# Patient Record
Sex: Female | Born: 1965 | Race: White | Hispanic: No | Marital: Married
Health system: Midwestern US, Community
[De-identification: ages and names within clinical notes are randomized; demographics above are authoritative.]

## PROBLEM LIST (undated history)

## (undated) DIAGNOSIS — K219 Gastro-esophageal reflux disease without esophagitis: Secondary | ICD-10-CM

## (undated) DIAGNOSIS — F329 Major depressive disorder, single episode, unspecified: Secondary | ICD-10-CM

## (undated) DIAGNOSIS — M542 Cervicalgia: Secondary | ICD-10-CM

## (undated) DIAGNOSIS — G8929 Other chronic pain: Secondary | ICD-10-CM

## (undated) DIAGNOSIS — M797 Fibromyalgia: Secondary | ICD-10-CM

## (undated) DIAGNOSIS — F32A Depression, unspecified: Secondary | ICD-10-CM

## (undated) DIAGNOSIS — R5382 Chronic fatigue, unspecified: Secondary | ICD-10-CM

## (undated) DIAGNOSIS — F1021 Alcohol dependence, in remission: Secondary | ICD-10-CM

## (undated) DIAGNOSIS — M549 Dorsalgia, unspecified: Secondary | ICD-10-CM

## (undated) DIAGNOSIS — A6920 Lyme disease, unspecified: Secondary | ICD-10-CM

## (undated) DIAGNOSIS — F909 Attention-deficit hyperactivity disorder, unspecified type: Secondary | ICD-10-CM

## (undated) DIAGNOSIS — R87619 Unspecified abnormal cytological findings in specimens from cervix uteri: Secondary | ICD-10-CM

## (undated) DIAGNOSIS — R443 Hallucinations, unspecified: Secondary | ICD-10-CM

## (undated) HISTORY — DX: Fibromyalgia: M79.7

## (undated) HISTORY — PX: HERNIA REPAIR: SHX51

## (undated) HISTORY — DX: Depression, unspecified: F32.A

## (undated) HISTORY — DX: Attention-deficit hyperactivity disorder, unspecified type: F90.9

## (undated) HISTORY — DX: Major depressive disorder, single episode, unspecified: F32.9

## (undated) HISTORY — DX: Unspecified abnormal cytological findings in specimens from cervix uteri: R87.619

## (undated) HISTORY — DX: Gastro-esophageal reflux disease without esophagitis: K21.9

## (undated) HISTORY — PX: COLPOSCOPY: SHX161

## (undated) HISTORY — DX: Alcohol dependence, in remission: F10.21

---

## 2002-10-25 HISTORY — PX: HAND SURGERY: SHX662

## 2003-01-16 ENCOUNTER — Inpatient Hospital Stay (HOSPITAL_COMMUNITY): Admission: EM | Admit: 2003-01-16 | Discharge: 2003-01-18 | Payer: Self-pay | Admitting: Emergency Medicine

## 2003-01-16 ENCOUNTER — Encounter: Payer: Self-pay | Admitting: Emergency Medicine

## 2003-01-17 ENCOUNTER — Encounter: Payer: Self-pay | Admitting: Cardiology

## 2003-09-06 ENCOUNTER — Emergency Department (HOSPITAL_COMMUNITY): Admission: EM | Admit: 2003-09-06 | Discharge: 2003-09-06 | Payer: Self-pay

## 2004-04-14 ENCOUNTER — Emergency Department (HOSPITAL_COMMUNITY): Admission: EM | Admit: 2004-04-14 | Discharge: 2004-04-14 | Payer: Self-pay | Admitting: Emergency Medicine

## 2004-08-18 ENCOUNTER — Emergency Department (HOSPITAL_COMMUNITY): Admission: EM | Admit: 2004-08-18 | Discharge: 2004-08-18 | Payer: Self-pay

## 2004-08-19 ENCOUNTER — Ambulatory Visit (HOSPITAL_COMMUNITY): Admission: RE | Admit: 2004-08-19 | Discharge: 2004-08-19 | Payer: Self-pay | Admitting: Orthopedic Surgery

## 2004-08-19 ENCOUNTER — Ambulatory Visit (HOSPITAL_BASED_OUTPATIENT_CLINIC_OR_DEPARTMENT_OTHER): Admission: RE | Admit: 2004-08-19 | Discharge: 2004-08-19 | Payer: Self-pay | Admitting: Orthopedic Surgery

## 2010-10-25 DIAGNOSIS — R87619 Unspecified abnormal cytological findings in specimens from cervix uteri: Secondary | ICD-10-CM

## 2010-10-25 HISTORY — DX: Unspecified abnormal cytological findings in specimens from cervix uteri: R87.619

## 2011-03-12 NOTE — H&P (Signed)
NAME:  Miranda [name], Miranda [name]                            ACCOUNT NO.:  [id number]   MEDICAL RECORD NO.:  [id number]                   PATIENT TYPE:  INP   LOCATION:  2023                                 FACILITY:  [facility]   PHYSICIAN:  Cassell Clement, M.D.              DATE OF BIRTH:  Apr 19, 1966   DATE OF ADMISSION:  01/16/2003  DATE OF DISCHARGE:                                HISTORY & PHYSICAL   CHIEF COMPLAINT:  Chest pain.   HISTORY:  The patient is a 45 year old single Caucasian female admitted with  chest pain.  She had the onset of chest pain while driving her car at 8:29  a.m.  The pain was sharp and like a dagger and went through her right chest  along the sternal border into the interscapular area.  Pain is worse with  deep breathing.  She has noted some improvement in the pain after sublingual  nitroglycerin on several occasions today.  She had nausea but no vomiting  and no sweating.  She has had no recent bronchitis or lung condition and she  has had no hemoptysis.  She has had no chills or fever or constitutional  symptoms.  The pain occurred while she was driving to work.  She went to  work and attempted to work but the pain became more severe and she called  her supervisor who arranged for transfer to the emergency room where she was  evaluated and admitted.  In the emergency room, she had normal chest x-ray  and normal EKG and initial set of enzymes are normal.   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  She is up for possibly a promotion to  principal and has an interview next week.  She does not drink any alcohol.  She smokes a pack of cigarettes a day.  She is single.   ALLERGIES:  No known drug allergies.   PAST MEDICAL HISTORY:  She has had no operations and no previous  hospitalizations.  She is on Wellbutrin XL 300 mg daily for depression and  has been on for 2 years.  Dr. Raquel James is her  psychiatrist.   REVIEW OF SYSTEMS:  GASTROINTESTINAL: Mild constipation.  There is no  history of peptic ulcer or gallbladder.  CARDIOVASCULAR: No history of  exertional chest pain.  She does not get any regular exercise.  GENITOURINARY: History reveals last menstrual period was 01/06/2003 and was  regular.  She denies any dysuria.  CONSTITUTIONAL: Symptoms unremarkable.  No night sweats or fever.  Remainder of review of systems negative in  detail.  Of note is the fact that the patient does not have any history of  diabetes or hypercholesterolemia.   FAMILY HISTORY:  Her family history reveals that her mother is living with  high blood pressure, her father has high blood pressure  and is alive, she  had a maternal grandfather who died of a massive heart attack.   PHYSICAL EXAMINATION:  VITAL SIGNS:  Blood pressure is 100/70, pulse is 90  and regular, respirations are normal.  GENERAL:  Well-developed, well-nourished, young woman in no distress.  SKIN:  Clear.  HEAD AND NECK:  Pupils are equal and reactive.  Sclerae are clear.  Mouth  and pharynx normal.  Jugular venous pressure normal.  Thyroid normal.  No  lymphadenopathy.  The carotids reveal no bruits.  CHEST:  Clear to percussion and auscultation.  Her chest pain is worse when  she takes a deep breath.  There is no pleural friction rub.  HEART:  No murmur, gallop, rub, or click.  ABDOMEN:  Soft without hepatosplenomegaly or masses.  EXTREMITIES:  No phlebitis or edema.  She has good peripheral pulses.   LABORATORY DATA:  Her electrocardiogram shows normal sinus rhythm and is  within normal limits.  Her initial set of enzymes are normal.  CBC is  normal.  Cholesterol and hepatic function tests are pending.   IMPRESSION:  1. Atypical chest pain not relieved by sublingual nitroglycerin, rule out     myocardial infarction, rule out viral pleurisy.  2. Risk factors include smoking and family history.   DISPOSITION:  She is being  admitted to telemetry.  We will get serial  enzymes.  She is on a beta blocker and Lovenox.  We will check her fasting  lipids.  We will give her a trial of ibuprofen and a trial of simultaneous  Nexium.  We will consider a treadmill Cardiolite stress test in the morning.                                               Cassell Clement, M.D.    TB/MEDQ  D:  01/16/2003  T:  01/16/2003  Job:  [id number]   cc:   Kizzie Furnish, M.D.  [address]  [place]  [place] [address]  Fax: [phone]

## 2011-03-12 NOTE — Op Note (Signed)
NAMEKRISTEN, Miranda [name]                  ACCOUNT NO.:  [id number]   MEDICAL RECORD NO.:  [id number]          PATIENT TYPE:  AMB   LOCATION:  DSC                          FACILITY:  [facility]   PHYSICIAN:  Cindee Salt, M.D.       DATE OF BIRTH:  [birth year]   DATE OF PROCEDURE:  08/19/2004  DATE OF DISCHARGE:                                 OPERATIVE REPORT   PREOPERATIVE DIAGNOSIS:  Cat bite with infection, left hand and forearm.   POSTOPERATIVE DIAGNOSIS:  Cat bite with infection, left hand and forearm.   OPERATION:  Incision and drainage, left hand and forearm.   SURGEON:  Cindee Salt, M.D.   ANESTHESIA:  General.   HISTORY:  The patient is a 45 year old female who suffered a cat bite over  the weekend.  She was seen in [facility] ER on August 18, 2004, where she was  placed on an antibiotic, given Unasyn and Augmentin.  She has had continued  pain.  She has some mild erythema about the areas, no fluctuance, no  lymphangitis.  Neurovascularly the hand is intact.   PROCEDURE:  The patient is brought to the operating room, where a general  anesthetic was carried out without difficulty.  She was prepped using  Duraprep in supine position, left arm free.  The arm was elevated for  exsanguination, the tourniquet placed high in the arm was inflated to 250  mmHg.  Incision was made over the cat bites on the dorsal aspect of the  dorsal aspect of the forearm, carried down through subcutaneous tissue.  Cultures were taken for both aerobic and anaerobic cultures and dissection  carried down to the fascia, which was opened to be certain that it had not  entered it.  No gross purulence was encountered.  This was irrigated and  packed.  A separate incision was made over the metacarpophalangeal joint and  carried down.  The puncture wound was visible through the extensor tendon.  Minimal swelling was present in the joint; however, this was opened to be  certain that there was no purulent material, and this  was irrigated out and  packed open with iodoform.  The bite over the web space of the ring and  little finger was then opened with a longitudinal incision, carried down  into the subcutaneous tissue.  It did not enter into the joint or between  the joint spaces.  This was irrigated and packed with iodoform.  A sterile  compressive dressing and splint was applied.  The patient tolerated the  procedure well and was taken to the recovery room for observation in  satisfactory condition.  She is given a dose of Unasyn prior to being taken  to the recovery room.  She is discharged home to return on Monday on  Augmentin and Vicodin.       GK/MEDQ  D:  08/19/2004  T:  08/19/2004  Job:  [id number]

## 2011-04-22 DIAGNOSIS — K4190 Unilateral femoral hernia, without obstruction or gangrene, not specified as recurrent: Secondary | ICD-10-CM

## 2011-04-27 ENCOUNTER — Telehealth (INDEPENDENT_AMBULATORY_CARE_PROVIDER_SITE_OTHER): Payer: Self-pay | Admitting: Surgery

## 2011-04-27 NOTE — Telephone Encounter (Signed)
Patient called, pain level 5-6 at incision - comes and goes- bm's ok - taking stool softener Pt advised to use aleve or ibuprofen b/t percocet use.  Percocet #20 written by Dr. Donell Beers. 04/27/2011

## 2011-04-27 NOTE — Telephone Encounter (Signed)
Miranda [name] [phone] needs her pain  meds refilled... pls call pharmacy

## 2011-05-03 ENCOUNTER — Encounter (INDEPENDENT_AMBULATORY_CARE_PROVIDER_SITE_OTHER): Payer: Self-pay | Admitting: Surgery

## 2011-06-14 ENCOUNTER — Telehealth (INDEPENDENT_AMBULATORY_CARE_PROVIDER_SITE_OTHER): Payer: Self-pay

## 2011-06-14 NOTE — Telephone Encounter (Signed)
RTW note faxed to Antionette Poles [phone] at patient's request. Patient stated she rtw on 05/07/2011.  OK per Dr. Gerrit Friends.  Patient advised to call back for an office visit asap while she is still in her post op period.  See copy of note in paper chart # [id number]. RMP

## 2011-10-09 ENCOUNTER — Ambulatory Visit (INDEPENDENT_AMBULATORY_CARE_PROVIDER_SITE_OTHER): Payer: BC Managed Care – PPO

## 2011-10-09 DIAGNOSIS — R05 Cough: Secondary | ICD-10-CM

## 2011-10-09 DIAGNOSIS — R059 Cough, unspecified: Secondary | ICD-10-CM

## 2011-10-09 DIAGNOSIS — F17200 Nicotine dependence, unspecified, uncomplicated: Secondary | ICD-10-CM

## 2011-12-07 ENCOUNTER — Ambulatory Visit (INDEPENDENT_AMBULATORY_CARE_PROVIDER_SITE_OTHER): Payer: BC Managed Care – PPO | Admitting: Family Medicine

## 2011-12-07 VITALS — BP 109/75 | HR 82 | Temp 98.0°F | Resp 16 | Ht 66.5 in | Wt 195.0 lb

## 2011-12-07 DIAGNOSIS — R05 Cough: Secondary | ICD-10-CM

## 2011-12-07 DIAGNOSIS — R509 Fever, unspecified: Secondary | ICD-10-CM

## 2011-12-07 DIAGNOSIS — R059 Cough, unspecified: Secondary | ICD-10-CM

## 2011-12-07 MED ORDER — AZITHROMYCIN 250 MG PO TABS: ORAL_TABLET | ORAL | Status: AC

## 2011-12-07 MED ORDER — GUAIFENESIN-CODEINE 100-10 MG/5ML PO SYRP: 5.0000 mL | ORAL_SOLUTION | Freq: Three times a day (TID) | ORAL | Status: AC | PRN

## 2011-12-07 NOTE — Progress Notes (Signed)
  Subjective:    Patient ID: Miranda [name], female    DOB: May 21, 1966, 46 y.o.   MRN: [id number]  HPISusan C [name] is a 46 y.o. female Started 3d ago - st.  Fever - tmax 101.8 yesterday.  Cough worst sx now.  Productive of green phlegm.  Insomnia o/n d/t cough.  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 WU:JWJX seltzer cold plus, ibuprofen, cough drops.   Review of Systems  Constitutional: Positive for fever and chills. Negative for appetite change.  HENT: Positive for hearing loss, ear pain, congestion, sore throat, rhinorrhea, sneezing and tinnitus.   Respiratory: Positive for cough. Negative for shortness of breath and wheezing.        Green phlegm.  Gastrointestinal: Negative for nausea, vomiting and diarrhea.  Skin: Negative for rash.       Objective:   Physical Exam  Constitutional: She is oriented to person, place, and time. She appears well-developed and well-nourished.  HENT:  Head: Normocephalic and atraumatic.  Right Ear: External ear normal.  Left Ear: External ear normal.  Nose: Right sinus exhibits maxillary sinus tenderness. Left sinus exhibits maxillary sinus tenderness.  Mouth/Throat: Oropharynx is clear and moist. No oropharyngeal exudate.  Eyes: Conjunctivae and EOM are normal. Pupils are equal, round, and reactive to light.  Neck: Neck supple.  Cardiovascular: Normal rate, regular rhythm, normal heart sounds and intact distal pulses.   Pulmonary/Chest: Effort normal and breath sounds normal. No respiratory distress. She has no wheezes.  Lymphadenopathy:    She has no cervical adenopathy.  Neurological: She is alert and oriented to person, place, and time.  Skin: Skin is warm and dry.  Psychiatric: She has a normal mood and affect.          Assessment & Plan:   1. Fever   2. Cough    Uri vs early sinobronchitis with fever.  Sx care - mucinex, fluids, rest, Zpak #1.  Return to the clinic or go to the nearest emergency room if any of your symptoms worsen  or new symptoms occur.

## 2011-12-07 NOTE — Patient Instructions (Signed)
mucinex DM during day, robitussin with codeine at night if needed.  Fluids, rest.   zpak as instructed. Return to the clinic or go to the nearest emergency room if any of your symptoms worsen or new symptoms occur.

## 2012-08-03 LAB — HM PAP SMEAR: HM Pap smear: NORMAL

## 2012-08-31 ENCOUNTER — Ambulatory Visit (INDEPENDENT_AMBULATORY_CARE_PROVIDER_SITE_OTHER): Payer: BC Managed Care – PPO | Admitting: Family Medicine

## 2012-08-31 ENCOUNTER — Ambulatory Visit: Payer: BC Managed Care – PPO

## 2012-08-31 VITALS — BP 130/74 | HR 99 | Temp 98.3°F | Resp 18 | Ht 66.0 in | Wt 195.0 lb

## 2012-08-31 DIAGNOSIS — M791 Myalgia, unspecified site: Secondary | ICD-10-CM

## 2012-08-31 DIAGNOSIS — R05 Cough: Secondary | ICD-10-CM

## 2012-08-31 DIAGNOSIS — IMO0001 Reserved for inherently not codable concepts without codable children: Secondary | ICD-10-CM

## 2012-08-31 DIAGNOSIS — R079 Chest pain, unspecified: Secondary | ICD-10-CM

## 2012-08-31 DIAGNOSIS — R059 Cough, unspecified: Secondary | ICD-10-CM

## 2012-08-31 DIAGNOSIS — M2550 Pain in unspecified joint: Secondary | ICD-10-CM

## 2012-08-31 LAB — POCT CBC
Granulocyte percent: 65.2 %G (ref 37–80)
HCT, POC: 44.5 % (ref 37.7–47.9)
Hemoglobin: 13.9 g/dL (ref 12.2–16.2)
Lymph, poc: 2.2 (ref 0.6–3.4)
MCH, POC: 29.6 pg (ref 27–31.2)
MCHC: 31.2 g/dL — AB (ref 31.8–35.4)
MCV: 94.9 fL (ref 80–97)
MID (cbc): 0.6 (ref 0–0.9)
MPV: 7.5 fL (ref 0–99.8)
POC Granulocyte: 5.3 (ref 2–6.9)
POC LYMPH PERCENT: 27.6 %L (ref 10–50)
POC MID %: 7.2 %M (ref 0–12)
Platelet Count, POC: 474 10*3/uL — AB (ref 142–424)
RBC: 4.69 M/uL (ref 4.04–5.48)
RDW, POC: 12.3 %
WBC: 8.1 10*3/uL (ref 4.6–10.2)

## 2012-08-31 LAB — POCT INFLUENZA A/B
Influenza A, POC: NEGATIVE
Influenza B, POC: NEGATIVE

## 2012-08-31 MED ORDER — HYDROCOD POLST-CHLORPHEN POLST 10-8 MG/5ML PO LQCR: 5.0000 mL | Freq: Two times a day (BID) | ORAL | Status: DC | PRN

## 2012-08-31 MED ORDER — AZITHROMYCIN 250 MG PO TABS: ORAL_TABLET | ORAL | Status: DC

## 2012-08-31 NOTE — Progress Notes (Signed)
Subjective:    Patient ID: Miranda [name], female    DOB: Mar 01, 1966, 46 y.o.   MRN: [id number]  HPI  [name] is a 46 yr old female here with one day of cough and chest pain.  States this started out of the blue yesterday.  She is coughing up "green stuff".  Chest pain occurs with cough.  There is no associated diaphoresis, nausea, arm pain, back pain, SOB.  She has felt cold but has not had a fever; however she has been taking 600mg  ibuprofen every 6 hours for the past day which may be masking fever.  She states she feels achy, has a headache.  Has some nasal congestion.  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  Has had flu shot.  States she has a history of both bronchitis and walking pneumonia.   Review of Systems  Constitutional: Positive for chills and appetite change (decreased). Negative for fever.  HENT: Positive for congestion. Negative for ear pain, sore throat, neck pain and neck stiffness.   Respiratory: Positive for cough and wheezing. Negative for shortness of breath.   Cardiovascular: Positive for chest pain (with cough).  Gastrointestinal: Negative.   Genitourinary: Negative.   Musculoskeletal: Positive for myalgias and arthralgias.  Skin: Negative.   Neurological: Positive for headaches. Negative for dizziness, syncope and light-headedness.       Objective:   Physical Exam  Vitals reviewed. Constitutional: She is oriented to person, place, and time. She appears well-developed and well-nourished. No distress.  HENT:  Head: Normocephalic and atraumatic.  Right Ear: Tympanic membrane and ear canal normal.  Left Ear: Tympanic membrane and ear canal normal.  Nose: Nose normal.  Mouth/Throat: Uvula is midline, oropharynx is clear and moist and mucous membranes are normal.  Neck: Neck supple.  Cardiovascular: Normal rate, regular rhythm and normal heart sounds.   Pulmonary/Chest: Effort normal. No accessory muscle usage. No respiratory  distress. She has no decreased breath sounds. She has no wheezes. She has rhonchi in the left middle field. She exhibits tenderness.  Lymphadenopathy:    She has no cervical adenopathy.  Neurological: She is alert and oriented to person, place, and time.  Skin: Skin is warm and dry.  Psychiatric: She has a normal mood and affect. Her behavior is normal.     Filed Vitals:   08/31/12 1503  BP: 130/74  Pulse: 99  Temp: 98.3 F (36.8 C)  Resp: 18    Results for orders placed in visit on 08/31/12  POCT INFLUENZA A/B      Component Value Range   Influenza A, POC Negative     Influenza B, POC Negative    POCT CBC      Component Value Range   WBC 8.1  4.6 - 10.2 K/uL   Lymph, poc 2.2  0.6 - 3.4   POC LYMPH PERCENT 27.6  10 - 50 %L   MID (cbc) 0.6  0 - 0.9   POC MID % 7.2  0 - 12 %M   POC Granulocyte 5.3  2 - 6.9   Granulocyte percent 65.2  37 - 80 %G   RBC 4.69  4.04 - 5.48 M/uL   Hemoglobin 13.9  12.2 - 16.2 g/dL   HCT, POC 30.8  65.7 - 47.9 %   MCV 94.9  80 - 97 fL   MCH, POC 29.6  27 - 31.2 pg   MCHC 31.2 (*) 31.8 - 35.4 g/dL  RDW, POC 12.3     Platelet Count, POC 474 (*) 142 - 424 K/uL   MPV 7.5  0 - 99.8 fL      [facility] reading (PRIMARY) by  Dr. Neva Seat - some increased markings in the RLL, possible early pneumonia.       Assessment & Plan:   1. Cough  POCT Influenza A/B, DG Chest 2 View, POCT CBC, azithromycin (ZITHROMAX) 250 MG tablet, chlorpheniramine-HYDROcodone (TUSSIONEX PENNKINETIC ER) 10-8 MG/5ML LQCR  2. Chest pain  DG Chest 2 View  3. Myalgia    4. Arthralgia      Ms. Miranda [name] is a 46 yr old female here with abrupt onset of productive cough and body aches one day ago.  Rapid flu is negative.  CXR shows a possible early pneumonia in the RLL.  Will treat with a Zpak and Tussionex for cough relief.  Chest pain is likely non-cardiac and secondary to her cough.  The pain occurs only with cough and is also somewhat reproducible on palpation.  There are no  associated symptoms of dizziness, diaphoresis, nausea, or radiating pain.  She has no risk factors aside from a smoking history.  Discussed with patient that I think her CP is cough related, but that a cardiac source is possible.  Discussed that she should proceed to the ED if CP worsens or if she develops new symptoms.  Pt is in agreement with this plan.

## 2012-08-31 NOTE — Patient Instructions (Signed)
Take ALL of the azithromycin as directed.  Use Tussionex cough syrup at night for cough relief.  Do not use this during the day as it will make you sleepy.  Drink plenty of fluids and get plenty of rest.  If you are worsening or not improving, come back in.  If the pain in your chest worsens, or if you notice other associated symptoms - dizziness, sweating, nausea, arm pain, back pain - proceed to the emergency department.

## 2012-09-01 NOTE — Progress Notes (Signed)
Xray read and patient discussed with Ms. Debbra Riding. Agree with assessment and plan of care per her note.  Radiology report: IMPRESSION:  Slight haziness in the right medial lung base is seen on the PA  image but no definite infiltrative density is evident on lateral  image. No consolidation is evident.

## 2012-12-20 ENCOUNTER — Ambulatory Visit (INDEPENDENT_AMBULATORY_CARE_PROVIDER_SITE_OTHER): Payer: BC Managed Care – PPO | Admitting: Family Medicine

## 2012-12-20 VITALS — BP 138/90 | HR 92 | Temp 98.4°F | Resp 16 | Ht 65.38 in | Wt 200.2 lb

## 2012-12-20 DIAGNOSIS — R059 Cough, unspecified: Secondary | ICD-10-CM

## 2012-12-20 DIAGNOSIS — R509 Fever, unspecified: Secondary | ICD-10-CM

## 2012-12-20 DIAGNOSIS — R05 Cough: Secondary | ICD-10-CM

## 2012-12-20 DIAGNOSIS — F32A Depression, unspecified: Secondary | ICD-10-CM | POA: Insufficient documentation

## 2012-12-20 DIAGNOSIS — F329 Major depressive disorder, single episode, unspecified: Secondary | ICD-10-CM

## 2012-12-20 DIAGNOSIS — R52 Pain, unspecified: Secondary | ICD-10-CM

## 2012-12-20 LAB — POCT INFLUENZA A/B
Influenza A, POC: NEGATIVE
Influenza B, POC: NEGATIVE

## 2012-12-20 MED ORDER — AZITHROMYCIN 250 MG PO TABS: ORAL_TABLET | ORAL | Status: DC

## 2012-12-20 NOTE — Progress Notes (Signed)
[facility] [address] [place] [address] [phone]  Date:  12/20/2012   Name:  Miranda [name]   DOB:  17-Sep-1966   MRN:  [id number]  PCP:  No primary provider on file.    Chief Complaint: Nasal Congestion and Fever   History of Present Illness:  Miranda [name] is a 47 y.o. very pleasant female patient who presents with the following:  Generally healthy female, she is a principal at an elementary school.  She recently was in close contact with an ill child- she became ill about 3 days ago.   She had a temp to around 100 degrees, but she was able to control it with motrin.   Coughing up mucus, feels very tired, aches all over.   She has noted a ST and itchy ears. No GI symptoms.   She has tried a neti- pot but this is not as helpful right now.  She did take motrin around 0530 this am.    There is no problem list on file for this patient.   Past Medical History  Diagnosis Date  . ADHD (attention deficit hyperactivity disorder)   . Depression     Past Surgical History  Procedure Laterality Date  . Hernia repair      History  Substance Use Topics  . Smoking status: Former Smoker -- 1.00 packs/day for 15 years    Types: Cigarettes    Quit date: 11/01/2011  . Smokeless tobacco: Not on file  . Alcohol Use: No    Family History  Problem Relation Age of Onset  . Diabetes Mother   . Hypertension Mother   . Heart disease Mother   . Hypertension Father   . Cancer Father     Allergies  Allergen Reactions  . Amoxicillin     Stomach pain    Medication list has been reviewed and updated.  Current Outpatient Prescriptions on File Prior to Visit  Medication Sig Dispense Refill  . lisdexamfetamine (VYVANSE) 20 MG capsule Take 50 mg by mouth every morning.       . sertraline (ZOLOFT) 100 MG tablet Take 100 mg by mouth daily.      Marland Kitchen azithromycin (ZITHROMAX) 250 MG tablet Take 2 tabs PO x 1 dose, then 1 tab PO QD x 4 days  6 tablet  0  .  chlorpheniramine-HYDROcodone (TUSSIONEX PENNKINETIC ER) 10-8 MG/5ML LQCR Take 5 mLs by mouth every 12 (twelve) hours as needed (cough).  140 mL  0   No current facility-administered medications on file prior to visit.    Review of Systems:  As per HPI- otherwise negative.   Physical Examination: Filed Vitals:   12/20/12 0759  BP: 138/90  Pulse: 92  Temp: 98.4 F (36.9 C)  Resp: 16   Filed Vitals:   12/20/12 0759  Height: 5' 5.38" (1.661 m)  Weight: 200 lb 3.2 oz (90.81 kg)   Body mass index is 32.92 kg/(m^2). Ideal Body Weight: Weight in (lb) to have BMI = 25: 151.7  GEN: WDWN, NAD, Non-toxic, A & O x 3, overweight HEENT: Atraumatic, Normocephalic. Neck supple. No masses, No LAD. Bilateral TM wnl, oropharynx normal.  PEERL,EOMI.   Slight tenderness over sinuses Ears and Nose: No external deformity. CV: RRR, No M/G/R. No JVD. No thrill. No extra heart sounds. PULM: CTA B, no wheezes, crackles, rhonchi. No retractions. No resp. distress. No accessory muscle use. Abd; soft, non tender and non- distended EXTR: No c/c/e NEURO  Normal gait.  PSYCH: Normally interactive. Conversant. Not depressed or anxious appearing.  Calm demeanor.   Results for orders placed in visit on 12/20/12  POCT INFLUENZA A/B      Result Value Range   Influenza A, POC Negative     Influenza B, POC Negative      Assessment and Plan: Fever, unspecified - Plan: POCT Influenza A/B  Body aches - Plan: POCT Influenza A/B  Cough - Plan: azithromycin (ZITHROMAX) 250 MG tablet  Treat for sinusitis with a zpack.  See patient instructions for more details.   No work until AF for 24 hours  Abbe Amsterdam, MD

## 2012-12-20 NOTE — Patient Instructions (Addendum)
Rest and drink plenty of fluids.  Let me know if you are not better in the next couple of days- Sooner if worse.    

## 2012-12-22 ENCOUNTER — Telehealth: Payer: Self-pay

## 2012-12-22 DIAGNOSIS — R05 Cough: Secondary | ICD-10-CM

## 2012-12-22 DIAGNOSIS — R059 Cough, unspecified: Secondary | ICD-10-CM

## 2012-12-22 MED ORDER — HYDROCOD POLST-CHLORPHEN POLST 10-8 MG/5ML PO LQCR: 5.0000 mL | Freq: Two times a day (BID) | ORAL | Status: DC | PRN

## 2012-12-22 NOTE — Telephone Encounter (Signed)
PT STATES SHE WAS GIVEN A Z-PAK, BUT REALLY WOULD LIKE TO HAVE SOME TUSSINEX CALLED IN FOR HER. SHE WAS COUGHING ALL NIGHT. PLEASE CALL [phone]    [facility] [address]

## 2012-12-22 NOTE — Telephone Encounter (Signed)
Sent in for her. [facility]. Called her to advise.

## 2012-12-22 NOTE — Telephone Encounter (Signed)
Rx for Tussionex printed and ready to send to pharmacy

## 2013-05-03 ENCOUNTER — Ambulatory Visit (INDEPENDENT_AMBULATORY_CARE_PROVIDER_SITE_OTHER): Payer: BC Managed Care – PPO | Admitting: Surgery

## 2013-05-03 ENCOUNTER — Encounter (INDEPENDENT_AMBULATORY_CARE_PROVIDER_SITE_OTHER): Payer: Self-pay | Admitting: Surgery

## 2013-05-03 VITALS — BP 128/74 | HR 92 | Temp 98.2°F | Resp 17 | Ht 66.0 in | Wt 200.0 lb

## 2013-05-03 DIAGNOSIS — M799 Soft tissue disorder, unspecified: Secondary | ICD-10-CM

## 2013-05-03 DIAGNOSIS — M7989 Other specified soft tissue disorders: Secondary | ICD-10-CM | POA: Insufficient documentation

## 2013-05-03 NOTE — Patient Instructions (Signed)

## 2013-05-03 NOTE — Progress Notes (Signed)
General Surgery [facility]  Visit Diagnoses: 1. Soft tissue mass, left upper quadrant abdominal wall     HISTORY: Patient is a 47 year old female known to my practice from a previous right inguinal hernia repair in 2012. She has developed a small soft tissue mass in the left upper quadrant of the abdominal wall and is concerned about another possible hernia.  EXAM: Examination is limited to the abdomen. There is a palpable less than 1 cm nodular density in the subcutaneous tissues of the mid left upper quadrant of the abdominal wall. This is moderately tender. It does not augment with cough or Valsalva. It is not reducible. There are no overlying cutaneous changes.  IMPRESSION: Soft tissue mass left upper quadrant abdominal wall, possible lipoma versus epidermal inclusion cyst versus small spigelian hernia  PLAN: Patient and I discussed this finding. She is not interested in operative exploration and excision at this time. I think it is safe to monitor this. If it becomes larger or more problematic, she will return for excisional biopsy or repair.  Patient will return for surgical care as needed.  Velora Heckler, MD, FACS General & Endocrine Surgery [facility]

## 2013-06-29 ENCOUNTER — Emergency Department (HOSPITAL_COMMUNITY): Payer: BC Managed Care – PPO

## 2013-06-29 ENCOUNTER — Emergency Department (HOSPITAL_COMMUNITY)
Admission: EM | Admit: 2013-06-29 | Discharge: 2013-06-29 | Disposition: A | Discharge status: Home / Self Care | Payer: BC Managed Care – PPO | Attending: Emergency Medicine | Admitting: Emergency Medicine

## 2013-06-29 ENCOUNTER — Encounter (HOSPITAL_COMMUNITY): Payer: Self-pay | Admitting: Emergency Medicine

## 2013-06-29 DIAGNOSIS — F909 Attention-deficit hyperactivity disorder, unspecified type: Secondary | ICD-10-CM | POA: Insufficient documentation

## 2013-06-29 DIAGNOSIS — R5381 Other malaise: Secondary | ICD-10-CM | POA: Insufficient documentation

## 2013-06-29 DIAGNOSIS — Z79899 Other long term (current) drug therapy: Secondary | ICD-10-CM | POA: Insufficient documentation

## 2013-06-29 DIAGNOSIS — R0602 Shortness of breath: Secondary | ICD-10-CM

## 2013-06-29 DIAGNOSIS — F3289 Other specified depressive episodes: Secondary | ICD-10-CM | POA: Insufficient documentation

## 2013-06-29 DIAGNOSIS — M25473 Effusion, unspecified ankle: Secondary | ICD-10-CM | POA: Insufficient documentation

## 2013-06-29 DIAGNOSIS — M25476 Effusion, unspecified foot: Secondary | ICD-10-CM | POA: Insufficient documentation

## 2013-06-29 DIAGNOSIS — R112 Nausea with vomiting, unspecified: Secondary | ICD-10-CM | POA: Insufficient documentation

## 2013-06-29 DIAGNOSIS — F329 Major depressive disorder, single episode, unspecified: Secondary | ICD-10-CM | POA: Insufficient documentation

## 2013-06-29 DIAGNOSIS — Z8719 Personal history of other diseases of the digestive system: Secondary | ICD-10-CM | POA: Insufficient documentation

## 2013-06-29 DIAGNOSIS — Z87891 Personal history of nicotine dependence: Secondary | ICD-10-CM | POA: Insufficient documentation

## 2013-06-29 LAB — CBC WITH DIFFERENTIAL/PLATELET
Basophils Absolute: 0 10*3/uL (ref 0.0–0.1)
Basophils Relative: 0 % (ref 0–1)
Eosinophils Absolute: 0 10*3/uL (ref 0.0–0.7)
Eosinophils Relative: 0 % (ref 0–5)
HCT: 38.1 % (ref 36.0–46.0)
Hemoglobin: 12.7 g/dL (ref 12.0–15.0)
Lymphocytes Relative: 20 % (ref 12–46)
Lymphs Abs: 2 10*3/uL (ref 0.7–4.0)
MCH: 30.5 pg (ref 26.0–34.0)
MCHC: 33.3 g/dL (ref 30.0–36.0)
MCV: 91.6 fL (ref 78.0–100.0)
Monocytes Absolute: 0.4 10*3/uL (ref 0.1–1.0)
Monocytes Relative: 5 % (ref 3–12)
Neutro Abs: 7.2 10*3/uL (ref 1.7–7.7)
Neutrophils Relative %: 75 % (ref 43–77)
Platelets: 397 10*3/uL (ref 150–400)
RBC: 4.16 MIL/uL (ref 3.87–5.11)
RDW: 12.5 % (ref 11.5–15.5)
WBC: 9.7 10*3/uL (ref 4.0–10.5)

## 2013-06-29 LAB — COMPREHENSIVE METABOLIC PANEL
ALT: 17 U/L (ref 0–35)
AST: 16 U/L (ref 0–37)
Albumin: 3.9 g/dL (ref 3.5–5.2)
Alkaline Phosphatase: 57 U/L (ref 39–117)
BUN: 12 mg/dL (ref 6–23)
CO2: 26 mEq/L (ref 19–32)
Calcium: 9.4 mg/dL (ref 8.4–10.5)
Chloride: 105 mEq/L (ref 96–112)
Creatinine, Ser: 0.72 mg/dL (ref 0.50–1.10)
GFR calc Af Amer: >90 mL/min (ref >90)
GFR calc non Af Amer: >90 mL/min (ref >90)
Glucose, Bld: 103 mg/dL — ABNORMAL HIGH (ref 70–99)
Potassium: 4 mEq/L (ref 3.5–5.1)
Sodium: 140 mEq/L (ref 135–145)
Total Bilirubin: 0.3 mg/dL (ref 0.3–1.2)
Total Protein: 7.3 g/dL (ref 6.0–8.3)

## 2013-06-29 LAB — POCT I-STAT TROPONIN I: Troponin i, poc: 0 ng/mL (ref 0.00–0.08)

## 2013-06-29 LAB — LIPASE, BLOOD: Lipase: 46 U/L (ref 11–59)

## 2013-06-29 LAB — D-DIMER, QUANTITATIVE: D-Dimer, Quant: <0.27 ug/mL-FEU (ref 0.00–0.48)

## 2013-06-29 MED ORDER — SODIUM CHLORIDE 0.9 % IV BOLUS (SEPSIS)
1000.0000 mL | Freq: Once | INTRAVENOUS | Status: AC
  Administered 2013-06-29: 1000 mL via INTRAVENOUS

## 2013-06-29 MED ORDER — ONDANSETRON HCL 4 MG/2ML IJ SOLN
4.0000 mg | INTRAMUSCULAR | Status: AC
  Administered 2013-06-29: 4 mg via INTRAVENOUS
  Filled 2013-06-29: qty 2

## 2013-06-29 MED ORDER — KETOROLAC TROMETHAMINE 30 MG/ML IJ SOLN
30.0000 mg | Freq: Once | INTRAMUSCULAR | Status: AC
  Administered 2013-06-29: 30 mg via INTRAVENOUS
  Filled 2013-06-29: qty 1

## 2013-06-29 MED ORDER — MORPHINE SULFATE 4 MG/ML IJ SOLN
4.0000 mg | Freq: Once | INTRAMUSCULAR | Status: AC
  Administered 2013-06-29: 4 mg via INTRAVENOUS
  Filled 2013-06-29: qty 1

## 2013-06-29 NOTE — ED Notes (Signed)
Lab called to add BNP, states will add.

## 2013-06-29 NOTE — ED Notes (Signed)
Weak sob cp since last night vomiting sporatic x 2 days

## 2013-06-29 NOTE — ED Provider Notes (Signed)
CSN: [id number]     Arrival date & time 06/29/13  1431 History   First MD Initiated Contact with Patient 06/29/13 1516     Chief Complaint  Patient presents with  . Chest Pain  . Emesis  . Shortness of Breath   (Consider location/radiation/quality/duration/timing/severity/associated sxs/prior Treatment) HPI Comments: Patient is a 47 y/o female with no significant PMH who presents for fatigues, chest pressure and dyspnea on exertion x 2 days. Patient describes the chest pain as a pressure that is nonradiating. Pain is worse when exerting herself and improved slightly when lying supine. Patient has not tried any OTC remedies for symptoms. She states that she is very active usually and has been becoming increasingly short of breath with symptoms. She endorses 2 episodes of spontaneous NB/NB emesis in the last week and has also noticed some swelling in her b/l ankles as well as calf tenderness when ambulating. She denies associated fevers, lightheadedness, dizziness, syncope, hemoptysis, cough, abdominal pain, urinary symptoms, diarrhea, melena, hematochezia, and vaginal c/o. Patient further denies recent surgeries or hospitalizations, prolonged periods of immobility, and HRT. Denies hx of DVT/PE, coagulopathies, ACS, CAD, and DM. Patient does endorse family hx of ACS in relative at age of ~19. Patient is a former smoker; quit 1 year ago.  The history is provided by the patient. No language interpreter was used.    Past Medical History  Diagnosis Date  . ADHD (attention deficit hyperactivity disorder)   . Depression   . GERD (gastroesophageal reflux disease)    Past Surgical History  Procedure Laterality Date  . Hernia repair     Family History  Problem Relation Age of Onset  . Diabetes Mother   . Hypertension Mother   . Heart disease Mother   . Hypertension Father   . Cancer Father    History  Substance Use Topics  . Smoking status: Former Smoker -- 1.00 packs/day for 15 years   Types: Cigarettes    Quit date: 11/01/2011  . Smokeless tobacco: Not on file  . Alcohol Use: No   OB History   Grav Para Term Preterm Abortions TAB SAB Ect Mult Living                 Review of Systems  Constitutional: Positive for fatigue. Negative for fever.  Respiratory: Positive for shortness of breath. Negative for cough.   Cardiovascular: Positive for chest pain.  Gastrointestinal: Positive for nausea and vomiting.  Neurological: Negative for syncope and numbness.  All other systems reviewed and are negative.    Allergies  Amoxicillin  Home Medications   Current Outpatient Rx  Name  Route  Sig  Dispense  Refill  . amphetamine-dextroamphetamine (ADDERALL) 30 MG tablet   Oral   Take 30 mg by mouth 2 (two) times daily.         Marland Kitchen HYDROcodone-acetaminophen (NORCO/VICODIN) 5-325 MG per tablet   Oral   Take 1 tablet by mouth every 6 (six) hours as needed for pain.         Marland Kitchen ibuprofen (ADVIL,MOTRIN) 200 MG tablet   Oral   Take 800 mg by mouth every 6 (six) hours as needed for pain.         Marland Kitchen sertraline (ZOLOFT) 100 MG tablet   Oral   Take 100 mg by mouth daily.         . traZODone (DESYREL) 100 MG tablet   Oral   Take 100 mg by mouth at bedtime.  BP 122/56  Pulse 74  Temp(Src) 98.7 F (37.1 C)  Resp 13  SpO2 100%  Physical Exam  Nursing note and vitals reviewed. Constitutional: She is oriented to person, place, and time. She appears well-developed and well-nourished. No distress.  HENT:  Head: Normocephalic and atraumatic.  Mouth/Throat: Oropharynx is clear and moist. No oropharyngeal exudate.  Eyes: Conjunctivae and EOM are normal. Pupils are equal, round, and reactive to light. No scleral icterus.  Neck: Normal range of motion.  Cardiovascular: Normal rate, regular rhythm, normal heart sounds and intact distal pulses.   Pulmonary/Chest: Effort normal. No respiratory distress. She has no wheezes. She has no rales.  Abdominal: Soft.  She exhibits no distension and no mass. There is tenderness (mild focal in RUQ). There is no rebound and no guarding.  No peritoneal signs. Negative Murphy's sign and no tenderness to palpation at McBurney's point.  Musculoskeletal: Normal range of motion.  Neurological: She is alert and oriented to person, place, and time.  Skin: Skin is warm and dry. No rash noted. She is not diaphoretic. No erythema. No pallor.  Psychiatric: She has a normal mood and affect. Her behavior is normal.    ED Course  Procedures (including critical care time) Labs Review Labs Reviewed  COMPREHENSIVE METABOLIC PANEL - Abnormal; Notable for the following:    Glucose, Bld 103 (*)    All other components within normal limits  CBC WITH DIFFERENTIAL  LIPASE, BLOOD  D-DIMER, QUANTITATIVE  PRO B NATRIURETIC PEPTIDE  POCT I-STAT TROPONIN I   Imaging Review Dg Chest 2 View  06/29/2013   *RADIOLOGY REPORT*  Clinical Data: Chest tightness and shortness of breath.  CHEST - 2 VIEW  Comparison: 08/31/2012  Findings: The lungs are clear without focal infiltrate, edema, pneumothorax or pleural effusion. The cardiopericardial silhouette is within normal limits for size. Imaged bony structures of the thorax are intact.  IMPRESSION: Normal exam.   Original Report Authenticated By: Kennith Center, M.D.   US Abdomen Complete  06/29/2013   CLINICAL DATA:  Chest pain. Emesis. Shortness of breath.  EXAM: ABDOMEN ULTRASOUND  COMPARISON:  None.  FINDINGS: Gallbladder  No gallstones or wall thickening. Negative sonographic Murphy's sign.  Common bile duct  Diameter: 3 mm  Liver  No focal lesion identified. Within normal limits in parenchymal echogenicity.  IVC  No abnormality visualized.  Pancreas  Visualized portion unremarkable.  Spleen  Size and appearance within normal limits.  Right Kidney  Length: 11.3 cm Echogenicity within normal limits. No mass or hydronephrosis visualized.  Left Kidney  Length: 11.6 cm Echogenicity within normal  limits. No mass or hydronephrosis visualized.  Abdominal aorta  No aneurysm visualized.  IMPRESSION: Negative abdominal ultrasound.   Electronically Signed   By: Herbie Baltimore   On: 06/29/2013 18:50   Date: 06/29/2013  Rate: 99  Rhythm: normal sinus rhythm  QRS Axis: normal  Intervals: normal  ST/T Wave abnormalities: normal  Conduction Disutrbances:none  Narrative Interpretation: NSR; no STEMI or ischemic change  Old EKG Reviewed: none available I have personally reviewed and interpreted this EKG  MDM   1. Shortness of breath    47 year old female who presents for shortness of breath with associated chest pressure x 2 days; she also endorses 2 episodes of spontaneous NB/NB emesis in the past few days. Patient work up in ED with CBC, CMP, troponin, lipase, d-dimer, BNP, and EKG as well as a chest x-ray and abdominal ultrasound. Patient, on arrival, is well and nontoxic appearing, hemodynamically  stable, and afebrile. Labs without anemia or electrolyte balance; liver and kidney function preserved. Lipase within normal limits and exam nonsuggestive of pancreatitis. D-dimer also within normal limits. Doubt pulmonary embolism given blood work as well as the fact that the patient has been without tachycardia, tachypnea, dyspnea, or hypoxia for duration of ED stay. She is PERC negative. Chest x-ray without evidence of pneumonia, pleural effusion, pneumothorax, or other acute cardiopulmonary abnormality. Patient EKG without STEMI or ischemic change and troponin 0.00. Doubt ACS given atypical nature of symptoms, physical exam, reassuring workup, and TIMI score of 0. BNP normal; no evidence to suggest congestive heart failure. Abdominal ultrasound also without evidence of cholecystitis or cholelithiasis; all intra-abdominal processes identified are within normal limits.  Patient with extensive workup for shortness of breath without significant findings. As she has remained hemodynamically stable, well  and nontoxic appearing, and afebrile, believe the patient is stable for discharge with primary care followup for further evaluation of her symptoms. Indications for ED return discussed and patient agreeable to plan with no unaddressed concerns.    Antony Madura, PA-C 06/29/13 1926  Antony Madura, PA-C 06/29/13 1928

## 2013-06-30 NOTE — ED Provider Notes (Signed)
Medical screening examination/treatment/procedure(s) were performed by non-physician practitioner and as supervising physician I was immediately available for consultation/collaboration.   Shelda Jakes, MD 06/30/13 1314

## 2013-07-02 ENCOUNTER — Encounter: Payer: Self-pay | Admitting: Family Medicine

## 2013-07-02 ENCOUNTER — Ambulatory Visit (INDEPENDENT_AMBULATORY_CARE_PROVIDER_SITE_OTHER): Payer: BC Managed Care – PPO | Admitting: Family Medicine

## 2013-07-02 VITALS — BP 123/83 | HR 95 | Temp 98.6°F | Resp 16 | Ht 66.0 in | Wt 206.0 lb

## 2013-07-02 DIAGNOSIS — F1021 Alcohol dependence, in remission: Secondary | ICD-10-CM

## 2013-07-02 DIAGNOSIS — F909 Attention-deficit hyperactivity disorder, unspecified type: Secondary | ICD-10-CM

## 2013-07-02 DIAGNOSIS — F1020 Alcohol dependence, uncomplicated: Secondary | ICD-10-CM

## 2013-07-02 DIAGNOSIS — R52 Pain, unspecified: Secondary | ICD-10-CM

## 2013-07-02 DIAGNOSIS — F32A Depression, unspecified: Secondary | ICD-10-CM

## 2013-07-02 DIAGNOSIS — F3289 Other specified depressive episodes: Secondary | ICD-10-CM

## 2013-07-02 DIAGNOSIS — F329 Major depressive disorder, single episode, unspecified: Secondary | ICD-10-CM

## 2013-07-02 MED ORDER — TRAZODONE HCL 100 MG PO TABS: 100.0000 mg | ORAL_TABLET | Freq: Every day | ORAL | Status: DC

## 2013-07-02 MED ORDER — HYDROCODONE-ACETAMINOPHEN 5-325 MG PO TABS: ORAL_TABLET | ORAL | Status: DC

## 2013-07-02 MED ORDER — AMPHETAMINE-DEXTROAMPHETAMINE 30 MG PO TABS: 30.0000 mg | ORAL_TABLET | Freq: Two times a day (BID) | ORAL | Status: DC

## 2013-07-02 MED ORDER — SERTRALINE HCL 100 MG PO TABS: 100.0000 mg | ORAL_TABLET | Freq: Every day | ORAL | Status: DC

## 2013-07-02 NOTE — Progress Notes (Signed)
Office Note 07/03/2013  CC:  Chief Complaint  Patient presents with  . Establish Care    HPI:  Miranda [name] is a 47 y.o. White female who is here with her female partner to establish care. Patient's most recent primary MD: Julie Swaziland, PA at [facility] in [place], --pt reports that the practice is closing.  Also is seeing a Rheumatologist: Dr. Kathi Ludwig at [facility].  Getting w/u for possible RA. Old records in EPIC/HL EMR were reviewed prior to or during today's visit.  Describes several year hx of body pains but has always remained active/worked.  However, the last 2 mo things have severely worsened: very bad pain and stiffness in morning, severe exhaustion with just getting ready to go to work in the mornings. Primary locations: hips, hands, fingers, back, but admits that a lot of the time she cannot localize the pain--"it's just everywhere". Recently has felt like her legs have been heavy, "dragging them" to go up stairs. Appetite down, particularly in regard to meats.  Has been vegetarian x 2 mo or so now.  Denies any abd pain, nausea, vomiting, or gassiness with eating--just feels repulsed by the thought of eating meat.  Recent episode of SOB that led to an ED visit.  Onset of SOB suddenly and felt heaviness in chest, lasted 2 days.  Full w/u in ED was unrevealing, IV morphine resolved her pain and she woke up the next day feeling fine--no SOB or CP. It has returned since then episodically, no known trigger, milder intensity.  Lasts anywhere from a couple of hours to a couple of days.  Initially, it was thought it was secondary to adderall b/c she was switched from vyvanse to this med around the same time she started having her currrent problems.  She cut her dose in half and has had days in which she did not take it at all but pt says sx's unchanged (she was on vyvanse prior to adderall but duration of action was not adequate per her report). Pt feels like a few  joints in hands have swollen and that her ankles have swollen but no other joint swelling. Describes "terrible" muscle achiness and stiffness in neck and both shoulders.  Once again, she gets vague very quickly, says pain is "everywhere".  No rashes.  No oral ulcers.  No unexplained fevers.  Anxiety hx: recalls one panic attack in 2002 that required brief hospitalization due to initial worry of heart attack. Most recent episode lasted much longer and was "different" in that it had a different kind of CP and no diaphoresis.  ROS: no dysuria, hematuria, urinary frequency, or urinary urgency.  No melena or hematochezia.  No HA's, no vision or hearing changes.  NO URI or ST.  +Some coughing but not clearly related to her chest/breathing complaints. No paresthesias or focal weakness.  No diarrhea.  +Some GER.   Past Medical History  Diagnosis Date  . ADHD (attention deficit hyperactivity disorder)   . Depression   . GERD (gastroesophageal reflux disease)   . Alcoholism in recovery     Past Surgical History  Procedure Laterality Date  . Hernia repair      Right inguinal and right femoral hernia (2 yrs apart, both repaired by Dr. Gerrit Friends)  . Hand surgery Left 2004    Cat bite--I&D for infection    Family History  Problem Relation Age of Onset  . Diabetes Mother   . Hypertension Mother   . Heart disease  Mother   . Hypertension Father   . Cancer Father     Prostate and melanoma  . Cancer Maternal Grandmother   . Heart disease Maternal Grandfather   . Heart disease Paternal Grandmother   . Heart disease Paternal Grandfather     History   Social History  . Marital Status: Single    Spouse Name: N/A    Number of Children: N/A  . Years of Education: N/A   Occupational History  . Not on file.   Social History Main Topics  . Smoking status: Former Smoker -- 1.00 packs/day for 15 years    Types: Cigarettes    Quit date: 11/01/2011  . Smokeless tobacco: Never Used  . Alcohol  Use: No  . Drug Use: No  . Sexual Activity: No   Other Topics Concern  . Not on file   Social History Narrative   Lives with partner in [place].  Orig from [place].   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   Ed level: Masters Degree   Tob 10 pack yr hx quit 2 yrs ago.   Alcoholism: quit 2009.   Outpatient Encounter Prescriptions as of 07/02/2013  Medication Sig Dispense Refill  . sertraline (ZOLOFT) 100 MG tablet Take 1 tablet (100 mg total) by mouth daily.  30 tablet  6  . traZODone (DESYREL) 100 MG tablet Take 1 tablet (100 mg total) by mouth at bedtime.  30 tablet  6  . [DISCONTINUED] sertraline (ZOLOFT) 100 MG tablet Take 100 mg by mouth daily.      . [DISCONTINUED] traZODone (DESYREL) 100 MG tablet Take 100 mg by mouth at bedtime.       Marland Kitchen amphetamine-dextroamphetamine (ADDERALL) 30 MG tablet Take 1 tablet (30 mg total) by mouth 2 (two) times daily.  60 tablet  0  . HYDROcodone-acetaminophen (NORCO/VICODIN) 5-325 MG per tablet 1-2 tabs po q6h prn pain  10 tablet  0  . ibuprofen (ADVIL,MOTRIN) 200 MG tablet Take 800 mg by mouth every 6 (six) hours as needed for pain.      . [DISCONTINUED] amphetamine-dextroamphetamine (ADDERALL) 30 MG tablet Take 30 mg by mouth 2 (two) times daily.      . [DISCONTINUED] HYDROcodone-acetaminophen (NORCO/VICODIN) 5-325 MG per tablet Take 1 tablet by mouth every 6 (six) hours as needed for pain.      . [DISCONTINUED] VYVANSE 50 MG capsule        No facility-administered encounter medications on file as of 07/02/2013.    Allergies  Allergen Reactions  . Amoxicillin     Stomach pain    ROS See HPI PE; Blood pressure 123/83, pulse 95, temperature 98.6 F (37 C), temperature source Temporal, resp. rate 16, height 5\' 6"  (1.676 m), weight 206 lb (93.441 kg), last menstrual period 06/28/2013, SpO2 98.00%. Pt examined with her female partner in the room. Gen: Alert, well appearing.  Patient is oriented to person, place, time, and situation. ENT:    Eyes: no injection, icteris, swelling, or exudate.  EOMI, PERRLA. Nose: no drainage or turbinate edema/swelling.  No injection or focal lesion.  Mouth: lips without lesion/swelling.  Oral mucosa pink and moist, without ulceration or other lesion.  Dentition intact and without obvious caries or gingival swelling.  Oropharynx without erythema, exudate, or swelling.  Neck - No masses or thyromegaly or limitation in range of motion CV: RRR, no m/r/g.   LUNGS: CTA bilat, nonlabored resps, good aeration in all lung fields. ABD: soft, diffusely TTP even with fairly  light/shallow palpation--voluntary guarding at times, ND, BS normal.  No hepatospenomegaly or mass.  No bruits. EXT: no clubbing, cyanosis, or edema.  Musculoskeletal: no joint swelling, erythema, or warmth.   She is tender to palpation literally everywhere I touch.  ROM of all joints intact. Skin - no sores or suspicious lesions or rashes or color changes  Pertinent labs:  None today  ASSESSMENT AND PLAN:   New pt.  Obtain old records.  Total body pain In the midst of Rheum w/u with [place]. Kathi [place] Obtain records. I agreed to do a small amount of pain med today, as she reports this does bring some relief.  However, if no objective evidence of rheumatologic problem then will need to discuss with pt alternative approaches to pain treatment. Vicodin 5/325, 1-2 q6h prn pain, #10, no RF.  Depression Stable on zoloft 100mg  qd. Sleep good on trazodone 100 mg qhs. Continue these.  Alcoholism in recovery Doing well with recovery--sober since 2009.  Adult ADHD Doing better on current regimen than she did on vyvanse. Rx for 1 mo supply given today: Adderall 30mg , 1/2-1 tab po bid, #60, no RF.  Spent 35 min with pt today, with >50% of this time spent in counseling and care coordination regarding the above problems.  An After Visit Summary was printed and given to the patient.  Return in about 1 month (around 08/01/2013) for f/u ADD  and rheum issues.

## 2013-07-03 DIAGNOSIS — R52 Pain, unspecified: Secondary | ICD-10-CM | POA: Insufficient documentation

## 2013-07-03 DIAGNOSIS — F909 Attention-deficit hyperactivity disorder, unspecified type: Secondary | ICD-10-CM | POA: Insufficient documentation

## 2013-07-03 DIAGNOSIS — F1021 Alcohol dependence, in remission: Secondary | ICD-10-CM | POA: Insufficient documentation

## 2013-07-03 NOTE — Assessment & Plan Note (Signed)
In the midst of Rheum w/u with Dr. Kathi Ludwig in [place] Obtain records. I agreed to do a small amount of pain med today, as she reports this does bring some relief.  However, if no objective evidence of rheumatologic problem then will need to discuss with pt alternative approaches to pain treatment. Vicodin 5/325, 1-2 q6h prn pain, #10, no RF.

## 2013-07-03 NOTE — Assessment & Plan Note (Signed)
Doing better on current regimen than she did on vyvanse. Rx for 1 mo supply given today: Adderall 30mg , 1/2-1 tab po bid, #60, no RF.

## 2013-07-03 NOTE — Assessment & Plan Note (Signed)
Doing well with recovery--sober since 2009.

## 2013-07-03 NOTE — Assessment & Plan Note (Signed)
Stable on zoloft 100mg  qd. Sleep good on trazodone 100 mg qhs. Continue these.

## 2013-07-05 ENCOUNTER — Telehealth: Payer: Self-pay | Admitting: Family Medicine

## 2013-07-05 DIAGNOSIS — R5383 Other fatigue: Secondary | ICD-10-CM | POA: Insufficient documentation

## 2013-07-05 MED ORDER — HYDROCODONE-ACETAMINOPHEN 5-325 MG PO TABS: ORAL_TABLET | ORAL | Status: DC

## 2013-07-05 NOTE — Telephone Encounter (Signed)
Patient called stating that her rheumatologist has made her a referral to pain management but she can't get in until next week.  Can you refill her vicodin until she can get into Pain management.  Please advise.

## 2013-07-05 NOTE — Telephone Encounter (Signed)
OK.  Rx printed for #30 Vicodin tabs. Pls request office notes from her rheum MD, Dr. Kathi Ludwig at [facility].  Also, see if patient has any specific info about her pain mgmt office (plus the date of her appt) so we can just verify info. -thx

## 2013-07-06 ENCOUNTER — Encounter: Payer: Self-pay | Admitting: Family Medicine

## 2013-07-06 NOTE — Telephone Encounter (Signed)
Noted. If she requests another RF after this then we'll have to get this info before any pain med rx is considered again.  --thx

## 2013-07-06 NOTE — Telephone Encounter (Signed)
Patient states that she is going to be going to preferred pain management and will see Dr. Jordan Likes.  There is no set date of appointment yet she is still waiting to here from them but believes that she will be seen sometime next week.  I faxed Rx to pharmacy.

## 2013-07-12 ENCOUNTER — Encounter: Payer: Self-pay | Admitting: Family Medicine

## 2013-07-12 ENCOUNTER — Other Ambulatory Visit: Payer: Self-pay | Admitting: Family Medicine

## 2013-07-12 ENCOUNTER — Ambulatory Visit (INDEPENDENT_AMBULATORY_CARE_PROVIDER_SITE_OTHER): Payer: BC Managed Care – PPO | Admitting: Family Medicine

## 2013-07-12 ENCOUNTER — Ambulatory Visit (HOSPITAL_BASED_OUTPATIENT_CLINIC_OR_DEPARTMENT_OTHER)
Admission: RE | Admit: 2013-07-12 | Discharge: 2013-07-12 | Disposition: A | Discharge status: Home / Self Care | Payer: BC Managed Care – PPO | Source: Ambulatory Visit | Attending: Family Medicine | Admitting: Family Medicine

## 2013-07-12 VITALS — BP 135/86 | HR 101 | Temp 99.4°F | Resp 18 | Ht 66.0 in | Wt 204.0 lb

## 2013-07-12 DIAGNOSIS — R5381 Other malaise: Secondary | ICD-10-CM | POA: Insufficient documentation

## 2013-07-12 DIAGNOSIS — R209 Unspecified disturbances of skin sensation: Secondary | ICD-10-CM

## 2013-07-12 DIAGNOSIS — R531 Weakness: Secondary | ICD-10-CM

## 2013-07-12 DIAGNOSIS — R208 Other disturbances of skin sensation: Secondary | ICD-10-CM

## 2013-07-12 DIAGNOSIS — M6281 Muscle weakness (generalized): Secondary | ICD-10-CM

## 2013-07-12 DIAGNOSIS — R449 Unspecified symptoms and signs involving general sensations and perceptions: Secondary | ICD-10-CM

## 2013-07-12 MED ORDER — PREGABALIN 150 MG PO CAPS: 150.0000 mg | ORAL_CAPSULE | Freq: Two times a day (BID) | ORAL | Status: DC

## 2013-07-12 NOTE — Progress Notes (Addendum)
OFFICE NOTE  07/12/2013  CC:  Chief Complaint  Patient presents with  . Pain     HPI: Patient is a 47 y.o. Caucasian female who is here "to discuss health issues".   I've seen her once--new pt visit. She has recently undergone extensive blood and radiographic w/u for possible rheum dz by Dr. Kathi Ludwig and this was all normal.  She has been referred by Dr. Kathi Ludwig to preferred pain mgmt and her first appt is 07/17/13.   Dr. Kathi Ludwig started a trial of lyrica 75mg  bid on 07/05/13 and plans to see her back for f/u in 4 wks.  Today she reports still hurting everywhere, with no focus of pain any worse than another--"I can barely get out of bed in the morning".  Also reports fingers on right hand and toes on right foot tingle.  She has "low grade" fever (99.5 or less), and skin bruising.  She is worried she may have something that is mimicking fibromyalgia.  Pertinent PMH:  Past Medical History  Diagnosis Date  . ADHD (attention deficit hyperactivity disorder)   . Depression   . GERD (gastroesophageal reflux disease)   . Alcoholism in recovery   . Fibromyalgia syndrome     Extensive rheum w/u by Dr. Kathi Ludwig (Rheum) 06/2013 --all NORMAL.  Marland Kitchen Abnormal Pap smear of cervix 2012    MEDS:  Outpatient Prescriptions Prior to Visit  Medication Sig Dispense Refill  . amphetamine-dextroamphetamine (ADDERALL) 30 MG tablet Take 1 tablet (30 mg total) by mouth 2 (two) times daily.  60 tablet  0  . sertraline (ZOLOFT) 100 MG tablet Take 1 tablet (100 mg total) by mouth daily.  30 tablet  6  . traZODone (DESYREL) 100 MG tablet Take 1 tablet (100 mg total) by mouth at bedtime.  30 tablet  6  . HYDROcodone-acetaminophen (NORCO/VICODIN) 5-325 MG per tablet 1-2 tabs po q6h prn pain  30 tablet  0  . ibuprofen (ADVIL,MOTRIN) 200 MG tablet Take 800 mg by mouth every 6 (six) hours as needed for pain.       No facility-administered medications prior to visit.    PE: Blood pressure 135/86, pulse 101, temperature 99.4 F  (37.4 C), temperature source Temporal, resp. rate 18, height 5\' 6"  (1.676 m), weight 204 lb (92.534 kg), last menstrual period 06/28/2013, SpO2 99.00%. Gen: Alert, well appearing.  Patient is oriented to person, place, time, and situation. AFFECT: appears in discomfort but is pleasant and displays normal range of emotion, lucid thought and speech. YQI:HKVQ: no injection, icteris, swelling, or exudate.  EOMI, PERRLA. Nose: no drainage or turbinate edema/swelling.  No injection or focal lesion.  Mouth: lips without lesion/swelling.  Oral mucosa pink and moist.  Dentition intact and without obvious caries or gingival swelling.  Oropharynx without erythema, exudate, or swelling.  Neck - No masses or thyromegaly or limitation in range of motion CV: RRR, no m/r/g.   LUNGS: CTA bilat, nonlabored resps, good aeration in all lung fields. ABD: soft, NT/ND EXT: no clubbing, cyanosis, or edema.  Muscles: no tenderness.  No joint swelling or tenderness. She has pain with every movement that she is asked to do. Neuro: CN 2-12 intact grossly bilat.  FNF is slightly impaired on right, as is heel-shin-ankle movement on right. Right arm and leg with 4/5 strength compared to 5/5 on left side. Decreased sensation (fine touch) in all toes of right foot except 5th toe, and all fingers of right hand except 5th digit.  Poor balance when asked  to balance on one foot, right worse than left.   Babinski testing elicits no upgoing or downgoing response on either side but patient states" ooh I feel that" on each side.   SKIN: she has 6-8 nonpalpable ecchymoses about 2cm -4 cm in size on LE's (mostly anterior tibial regions), these appear to be of various ages.  One fresh -looking ecchymoses on left upper arm.  No rash or focal lesions other than the bruises.  The bruises are nontender.  IMPRESSION AND PLAN:  1) Chronic pain syndrome most consistent with fibromyalgia, but she lacks the myofascial tenderness associated with  this disease.  I feel like rheumatologic dz has been effectively ruled out by her rheumatologist, and she has had a decent positive response to lyrica.  Will increase lyrica to 150mg  bid.  2) Acute (2 days) neurologic sx's, with evidence of right sided weakness and decreased sensation on exam today.  Will r/o acute CVA with noncontrast head CT now. If this is negative, will defer further w/u of these abnormalities to neurology: she prefers to see Dr. Meryl Crutch with [facility] if possible.  3) Bruising: benign-appearing.  Spent 60 min with pt today, with >50% of this time spent in counseling and care coordination regarding the above problems.   FOLLOW UP:  1 month   Addendum: CT head was negative today. Will proceed with neuro referral for further help.--PM

## 2013-07-13 ENCOUNTER — Other Ambulatory Visit: Payer: Self-pay | Admitting: Family Medicine

## 2013-07-28 ENCOUNTER — Ambulatory Visit (INDEPENDENT_AMBULATORY_CARE_PROVIDER_SITE_OTHER): Payer: BC Managed Care – PPO | Admitting: Family Medicine

## 2013-07-28 VITALS — BP 100/68 | HR 87 | Temp 98.6°F | Resp 12 | Ht 65.75 in | Wt 204.0 lb

## 2013-07-28 DIAGNOSIS — R5381 Other malaise: Secondary | ICD-10-CM

## 2013-07-28 DIAGNOSIS — H6690 Otitis media, unspecified, unspecified ear: Secondary | ICD-10-CM

## 2013-07-28 DIAGNOSIS — J029 Acute pharyngitis, unspecified: Secondary | ICD-10-CM

## 2013-07-28 DIAGNOSIS — H6691 Otitis media, unspecified, right ear: Secondary | ICD-10-CM

## 2013-07-28 LAB — POCT CBC
HCT, POC: 39.5 % (ref 37.7–47.9)
Hemoglobin: 12.2 g/dL (ref 12.2–16.2)
MCH, POC: 29.8 pg (ref 27–31.2)
MCV: 96.5 fL (ref 80–97)
MID (cbc): 0.4 (ref 0–0.9)
RBC: 4.09 M/uL (ref 4.04–5.48)
WBC: 8.6 10*3/uL (ref 4.6–10.2)

## 2013-07-28 LAB — POCT RAPID STREP A (OFFICE): Rapid Strep A Screen: NEGATIVE

## 2013-07-28 MED ORDER — CEFDINIR 300 MG PO CAPS: 600.0000 mg | ORAL_CAPSULE | Freq: Every day | ORAL | Status: DC

## 2013-07-28 NOTE — Patient Instructions (Signed)
Take Omnicef twice daily for ear infection.  Try to get regular exercise  If no answers to the fatigue issue, at some point may be worth trying a drug holiday and tapering down from your other medications and seeing how you feel without them  Viral Pharyngitis Viral pharyngitis is a viral infection that produces redness, pain, and swelling (inflammation) of the throat. It can spread from person to person (contagious). CAUSES Viral pharyngitis is caused by inhaling a large amount of certain germs called viruses. Many different viruses cause viral pharyngitis. SYMPTOMS Symptoms of viral pharyngitis include:  Sore throat.  Tiredness.  Stuffy nose.  Low-grade fever.  Congestion.  Cough. TREATMENT Treatment includes rest, drinking plenty of fluids, and the use of over-the-counter medication (approved by your caregiver). HOME CARE INSTRUCTIONS   Drink enough fluids to keep your urine clear or pale yellow.  Eat soft, cold foods such as ice cream, frozen ice pops, or gelatin dessert.  Gargle with warm salt water (1 tsp salt per 1 qt of water).  If over age 5, throat lozenges may be used safely.  Only take over-the-counter or prescription medicines for pain, discomfort, or fever as directed by your caregiver. Do not take aspirin. To help prevent spreading viral pharyngitis to others, avoid:  Mouth-to-mouth contact with others.  Sharing utensils for eating and drinking.  Coughing around others. SEEK MEDICAL CARE IF:   You are better in a few days, then become worse.  You have a fever or pain not helped by pain medicines.  There are any other changes that concern you. Document Released: 07/21/2005 Document Revised: 01/03/2012 Document Reviewed: 12/17/2010 [facility] Patient Information 2014 [place], [place].

## 2013-07-28 NOTE — Progress Notes (Signed)
Subjective: 47 year old lady who is here with the acute problem of a bad sore throat for the last couple of days. It gives a cutting pain in her throat. Not much in the way of upper respiratory or congestion type symptoms. Her right ear has a little discomfort. She does not smoke, having quit 2 years ago. It does turn with a lot of fatigue and has missed work for the last 3 weeks for this. She has seen her primary care doctor as well as her rheumatologist and a pain clinic. She is on a number of medications including hydrocodone for the pain, which she is taking 2 pills twice daily. Trazodone at bedtime 100 mg which she does not feel like helps her to rest much. She also takes Adderall for her ADHD, was tried on Lyrica but didn't tolerate it. And she takes sertraline 100 mg daily. She thought she she had fibromyalgia, but the pain clinic physician did not think that was the label.  She is frustrated with not feeling well and just wishes someone to find the answer for her.  Objective: Left TM is normal. Right is a little erythematous and dull. Her throat is erythematous without exudate this morning, though it sounds like she's also exudate there. Strep screen and culture were taken. Neck is supple without major nodes, seems tender. Chest is clear to auscultation. Heart regular without murmurs. Abdomen has some mild general tenderness but no hepatosplenomegaly. No axillary or obvious inguinal nodes.  Assessment: Pharyngitis Fatigue, etiology unclear Chronic pain syndrome ADHD  Plan: Strep screen and culture if needed  Results for orders placed in visit on 07/28/13  POCT RAPID STREP A (OFFICE)      Result Value Range   Rapid Strep A Screen Negative  Negative  POCT CBC      Result Value Range   WBC 8.6  4.6 - 10.2 K/uL   Lymph, poc 1.8  0.6 - 3.4   POC LYMPH PERCENT 21.5  10 - 50 %L   MID (cbc) 0.4  0 - 0.9   POC MID % 4.6  0 - 12 %M   POC Granulocyte 6.4  2 - 6.9   Granulocyte percent 73.9   37 - 80 %G   RBC 4.09  4.04 - 5.48 M/uL   Hemoglobin 12.2  12.2 - 16.2 g/dL   HCT, POC 09.8  11.9 - 47.9 %   MCV 96.5  80 - 97 fL   MCH, POC 29.8  27 - 31.2 pg   MCHC 30.9 (*) 31.8 - 35.4 g/dL   RDW, POC 14.7     Platelet Count, POC 406  142 - 424 K/uL   MPV 7.4  0 - 99.8 fL   Will treat for the otitis. Talk to her about the possibility of doing so, drug holiday with all her medications that might be getting too fatigued. Urged regular exercise.

## 2013-07-30 ENCOUNTER — Ambulatory Visit (INDEPENDENT_AMBULATORY_CARE_PROVIDER_SITE_OTHER): Payer: BC Managed Care – PPO | Admitting: Family Medicine

## 2013-07-30 ENCOUNTER — Encounter: Payer: Self-pay | Admitting: Family Medicine

## 2013-07-30 ENCOUNTER — Other Ambulatory Visit: Payer: Self-pay | Admitting: Family Medicine

## 2013-07-30 VITALS — BP 122/85 | HR 73 | Temp 98.8°F | Resp 18 | Ht 65.5 in | Wt 201.0 lb

## 2013-07-30 DIAGNOSIS — IMO0001 Reserved for inherently not codable concepts without codable children: Secondary | ICD-10-CM

## 2013-07-30 DIAGNOSIS — I959 Hypotension, unspecified: Secondary | ICD-10-CM

## 2013-07-30 DIAGNOSIS — R5381 Other malaise: Secondary | ICD-10-CM

## 2013-07-30 DIAGNOSIS — R5382 Chronic fatigue, unspecified: Secondary | ICD-10-CM

## 2013-07-30 DIAGNOSIS — M791 Myalgia, unspecified site: Secondary | ICD-10-CM

## 2013-07-30 DIAGNOSIS — M2550 Pain in unspecified joint: Secondary | ICD-10-CM

## 2013-07-30 DIAGNOSIS — J029 Acute pharyngitis, unspecified: Secondary | ICD-10-CM

## 2013-07-30 LAB — CULTURE, GROUP A STREP: Organism ID, Bacteria: NORMAL

## 2013-07-30 MED ORDER — ONDANSETRON 8 MG PO TBDP: 8.0000 mg | ORAL_TABLET | Freq: Three times a day (TID) | ORAL | Status: DC | PRN

## 2013-07-30 NOTE — Progress Notes (Signed)
OFFICE NOTE  07/30/2013  CC:  Chief Complaint  Patient presents with  . Sore Throat    x 3 days  . Headache  . Fever     HPI: Patient is a 47 y.o. Caucasian female who is here for ST.   Pt very emotional today, lights out in room, partner with her. Crying uncontrollably at times. Has FMLA papers for me to fill out today--per pt's report her pain mgmt md told her she had "facet disease" or "behcet's disease"--I could not tell from pt's pronunciation and I have no records, but not fibromyalgia.  Essentially her new sx's are about 3-4 days of severe ST, HA, very fatigued, some upset stomach and occ vomiting.  No diarrhea.  No rash.  No vision problems or ear pain.  No signif nasal sx's, no cough or wheezing. Partner feeling ok--no similar sx's.  Pt is concerned that she has mono.  She saw Dr. Alwyn Ren at Urgent care on [address] in [place] 2 d/a and strep testing was neg but he felt like she had a right OM so she was started on omnicef---she did not fill this rx.  Partner brings up possibility of Lyme dz and Addison's dz as possible explanations of her ongoing problems the last few months--based on some low bp's, fatigue, and nondescript/diffuse pain syndrome.  Pertinent PMH:  Past Medical History  Diagnosis Date  . ADHD (attention deficit hyperactivity disorder)   . Depression   . GERD (gastroesophageal reflux disease)   . Alcoholism in recovery   . Fibromyalgia syndrome     Extensive rheum w/u by Dr. Kathi Ludwig (Rheum) 06/2013 --all NORMAL.  Marland Kitchen Abnormal Pap smear of cervix 2012   Past surgical, social, and family history reviewed and no changes noted since last office visit.--Currently still not able to work-has been out since 07/02/13.  MEDS:  Outpatient Prescriptions Prior to Visit  Medication Sig Dispense Refill  . amphetamine-dextroamphetamine (ADDERALL) 30 MG tablet Take 1 tablet (30 mg total) by mouth 2 (two) times daily.  60 tablet  0  . HYDROcodone-acetaminophen (NORCO/VICODIN) 5-325  MG per tablet 1-2 tabs po q6h prn pain  30 tablet  0  . ibuprofen (ADVIL,MOTRIN) 200 MG tablet Take 800 mg by mouth every 6 (six) hours as needed for pain.      Marland Kitchen sertraline (ZOLOFT) 100 MG tablet Take 1 tablet (100 mg total) by mouth daily.  30 tablet  6  . cefdinir (OMNICEF) 300 MG capsule Take 2 capsules (600 mg total) by mouth daily.  20 capsule  0  . pregabalin (LYRICA) 150 MG capsule Take 1 capsule (150 mg total) by mouth 2 (two) times daily.  60 capsule  1  . traZODone (DESYREL) 100 MG tablet Take 1 tablet (100 mg total) by mouth at bedtime.  30 tablet  6   No facility-administered medications prior to visit.    PE: Blood pressure 122/85, pulse 73, temperature 98.8 F (37.1 C), temperature source Temporal, resp. rate 18, height 5' 5.5" (1.664 m), weight 201 lb (91.173 kg), last menstrual period 07/20/2013, SpO2 99.00%. Gen: lying on exam table in room with lights out.  She cries frequently, sometimes to the point of sobbing uncontrollably. EYES: no erythema, no drainage, no swelling, no icterus, no injection ENT: right TM with erythema and loss of landmarks.  Right EAC normal.  Left EAC and TM normal.  Right nostril a bit stuffy but no visible mucous.  Left nostril clear.  No facial pain/tenderness.   Lips,  buccal mucosa, and gingiva are clear of focal lesion.  Soft palate has approx 10 very small, shallow superficial ulcerations without erythema.  Post pharynx w/out erythema, swelling, focal lesion, or exudate.  No PND. Neck: supple, no LAD or mass. CV: RRR, no m/r/g.   LUNGS: CTA bilat, nonlabored resps, good aeration in all lung fields. ABD: soft, NT/ND, BS normal. EXT: no clubbing, cyanosis, or edema.   IMPRESSION AND PLAN:  1) suspect viral pharyngitis--infectious mono unlikely given the appearance of her throat.  Symptomatic care with salt water gargle, zofran 8mg  tid prn.  2) Right AOM--recommended pt fill the omnicef rx written 2 d/a by Dr. Alwyn Ren at Urgent Care.  3)  Chronic fatigue, chronic musculoskeletal pain, unclear etiology.  Seems to have made one step in the right direction with her pain mgmt MD--will request records to get more info--it appears he/she has her on two vicodin per day.  Pt self d/c'd her lyrica due to dizziness.  Pt requests referral to endocrinology for further w/u for possible addison's dz so I ordered this today.  She also requested that lyme antibodies be tested for so I have done this as well as a BMET today.  An After Visit Summary was printed and given to the patient.  FOLLOW UP: prn--will fill out FMLA form for pt today with approx date to return to work in 2 wks.

## 2013-07-31 DIAGNOSIS — Z0279 Encounter for issue of other medical certificate: Secondary | ICD-10-CM

## 2013-07-31 LAB — BASIC METABOLIC PANEL
Calcium: 9 mg/dL (ref 8.4–10.5)
Creat: 0.8 mg/dL (ref 0.50–1.10)

## 2013-07-31 LAB — EPSTEIN-BARR VIRUS VCA ANTIBODY PANEL
EBV EA IgG: 50.4 U/mL — ABNORMAL HIGH (ref <9.0)
EBV NA IgG: 49.2 U/mL — ABNORMAL HIGH (ref <18.0)

## 2013-07-31 LAB — LYME AB/WESTERN BLOT REFLEX: B burgdorferi Ab IgG+IgM: 0.46 {ISR}

## 2013-08-03 ENCOUNTER — Encounter: Payer: Self-pay | Admitting: Emergency Medicine

## 2013-08-03 ENCOUNTER — Ambulatory Visit: Payer: BC Managed Care – PPO | Admitting: Family Medicine

## 2013-08-06 ENCOUNTER — Encounter: Payer: Self-pay | Admitting: Internal Medicine

## 2013-08-06 ENCOUNTER — Ambulatory Visit (INDEPENDENT_AMBULATORY_CARE_PROVIDER_SITE_OTHER): Payer: BC Managed Care – PPO | Admitting: Internal Medicine

## 2013-08-06 VITALS — BP 124/84 | HR 91 | Temp 98.5°F | Resp 10 | Ht 66.0 in | Wt 206.0 lb

## 2013-08-06 DIAGNOSIS — R52 Pain, unspecified: Secondary | ICD-10-CM

## 2013-08-06 DIAGNOSIS — R5381 Other malaise: Secondary | ICD-10-CM

## 2013-08-06 DIAGNOSIS — R5382 Chronic fatigue, unspecified: Secondary | ICD-10-CM

## 2013-08-06 LAB — T3, FREE: T3, Free: 2.7 pg/mL (ref 2.3–4.2)

## 2013-08-06 LAB — T4, FREE: Free T4: 0.89 ng/dL (ref 0.60–1.60)

## 2013-08-06 LAB — CORTISOL
Cortisol, Plasma: 5.2 ug/dL
Cortisol, Plasma: 27.1 ug/dL

## 2013-08-06 NOTE — Progress Notes (Signed)
Subjective:     Patient ID: Miranda [name], female   DOB: [birth year], 47 y.o.   MRN: [id number]  HPI Patient is a 47 year old woman, referred by her PCP, Dr. Milinda Cave, for fatigue, diffuse pain, low blood pressure values, to rule out adrenal insufficiency.  Low BP: - Patient without orthostasis, tachycardia. Has occasional HAs. - Reviewed vital signs since 2013 to present:   Diffuse pain: - in 04/2013: generalized pain, especially hips, but needles and pins/numbness sensation in hands, feet, legs. Also, now severe mm and joint pain - Had extensive rheumatologic workup by Dr. Kathi Ludwig with rheumatology in 06/2013 (reviewed her note and labs) and all have been normal, except for CK slightly elevated at 189 (24-173). Of note, her TSH was 3.94 on 06/29/2013, previously 3.4. The BNP did not show hyponatremia or hyperkalemia. ESR normal on 06/27/2013. Alkaline phosphatase normal at 57 (37-117). She tried different arthritis medicines without improvement. She was told she had fibromyalgia >> She was recently started on Lyrica on 07/05/2013 >> very lethargic/dizzy >> stopped taking it.  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 - might need to have injections - has a history of fibromyalgia diagnosed 10 years ago by ED physician - He tells me that the only thing that is working for her pain, with a higher dose oxycodone that she borrowed from her partner. She asked me whether I can give her narcotic medicines today.  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 On Zoloft. Also anxiety.  - h/o mononucleosis 1992-1993  Other complaints: - emotional lability - memory problems starting this summer, too - forgot way home - SOB >> ED  - weakness R side of body >> taken to ED >> r/o for stroke - falls >> fell down stairs recently - HA - dizziness - back pain  - frequent urination - has occasional nausea (Zofran working well).  - easy bruising - She was concerned that she had mononucleosis and was seen by PCP: w/u negative. Lyme w/u was negative. Strep throat negative.  - not menopausal - not taking vitamins  Other labs: - Hemoglobin A1c 5.4%   She tells me that she practices meditation and is certified in Reiki healing, but the last healing that she did was this spring. 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 She would like to open her own Reiki clinic in the future. She feels great when she meditates and does not feel that functioning at the right heel are or medium tires her to the point that she is now. She feels that her coming off meat was a normal reaction to advancing to a higher level in her Reiki training.   Mother with thyroid ds.   Pt will see neurology tomorrow.   Review of Systems - see HPI + Constitutional: + fatigue, + decreased appetite however, with  weight gain, + subjective hypothermia, + poor sleep Eyes: no blurry vision, no xerophthalmia ENT: no sore throat, no nodules palpated in throat,+ dysphagia/no odynophagia, no hoarseness Cardiovascular: no CP/+SOB/no palpitations/leg swelling Respiratory: no cough/+SOB Gastrointestinal: + N/+ V/no D/no C, + heartburn Musculoskeletal:+muscle/+joint aches Skin: no rashes Neurological: no tremors/numbness/tingling/dizziness, + HA Psychiatric: + depression/+ anxiety  Past Medical History  Diagnosis Date  . ADHD (attention deficit hyperactivity disorder)   .  Depression   . GERD (gastroesophageal reflux disease)   . Alcoholism in recovery   . Fibromyalgia syndrome     Extensive rheum w/u by Dr. Kathi Ludwig (Rheum) 06/2013 --all NORMAL.  Marland Kitchen Abnormal Pap smear of cervix 2012   Past Surgical History  Procedure Laterality Date  . Hernia repair      Right inguinal and right femoral hernia (2 yrs apart, both repaired by Dr. Gerrit Friends)  . Hand surgery Left 2004    Cat bite--I&D for infection  . Colposcopy     History   Social History  . Marital Status: Single    Spouse Name: N/A    Number of Children: 0  . Years of Education: + Master degree   Occupational History  . 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 Smoking status: Former Smoker -- 1.00 packs/day for 15 years    Types: Cigarettes    Quit date: 11/01/2011  . Smokeless tobacco: Never Used  . Alcohol Use: No  . Drug Use: No  . Sexual Activity: No   Other Topics Concern  . Not on file   Social History Narrative   Lives with partner in [place].  Orig from [place].   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   Ed level: Masters Degree   Tob 10 pack yr hx quit 2 yrs ago.   Alcoholism: quit 2009.   Regular exercise: not at this time   Caffeine use: daily   Current Outpatient Prescriptions on File Prior to Visit  Medication Sig Dispense Refill  . amphetamine-dextroamphetamine (ADDERALL) 30 MG tablet Take 1 tablet (30 mg total) by mouth 2 (two) times daily.  60 tablet  0  . cefdinir (OMNICEF) 300 MG capsule Take 2 capsules (600 mg total) by mouth daily.  20 capsule  0  . HYDROcodone-acetaminophen (NORCO/VICODIN) 5-325 MG per tablet 1-2 tabs po q6h prn pain  30 tablet  0  . ibuprofen (ADVIL,MOTRIN) 200 MG tablet Take 800 mg by mouth every 6 (six) hours as needed for pain.      Marland Kitchen ondansetron (ZOFRAN-ODT) 8 MG disintegrating tablet Take 1 tablet (8 mg total) by mouth every 8 (eight) hours as needed for nausea.  30 tablet  1  . sertraline  (ZOLOFT) 100 MG tablet Take 1 tablet (100 mg total) by mouth daily.  30 tablet  6  . traZODone (DESYREL) 100 MG tablet Take 1 tablet (100 mg total) by mouth at bedtime.  30 tablet  6   No current facility-administered medications on file prior to visit.   Allergies  Allergen Reactions  . Amoxicillin     Stomach pain   Family History  Problem Relation Age of Onset  . Diabetes Mother   . Hypertension Mother   . Heart disease Mother   . Hypertension Father   . Cancer Father     Prostate and melanoma  . Cancer Maternal Grandmother   . Heart disease Maternal Grandfather   . Heart disease Paternal Grandmother   .  Heart disease Paternal Grandfather    Objective:   Physical Exam BP 124/84  Pulse 91  Temp(Src) 98.5 F (36.9 C) (Oral)  Resp 10  Ht 5\' 6"  (1.676 m)  Wt 206 lb (93.441 kg)  BMI 33.27 kg/m2  SpO2 97%  LMP 07/20/2013 Wt Readings from Last 3 Encounters:  08/06/13 206 lb (93.441 kg)  07/30/13 201 lb (91.173 kg)  07/28/13 204 lb (92.534 kg)   Constitutional: overweight, in NAD Eyes: PERRLA, EOMI, no exophthalmos ENT: moist mucous membranes, no thyromegaly, no cervical lymphadenopathy Cardiovascular: RRR, No MRG Respiratory: CTA B Gastrointestinal: abdomen soft, NT, ND, BS+ Musculoskeletal: no deformities, strength intact in all 4 Skin: moist, warm, no rashes Neurological: no tremor with outstretched hands, DTR normal in all 4  Assessment:     Fatigue/generalized pain/low BPs    Plan:     Patient presents with sudden onset of nonspecific symptoms: Fatigue, generalized pain, dizziness, numbness and tingling. The symptoms started somewhat abruptly, approximately 3 months ago. They continue to worsen. She had extensive rheumatologic workup with negative results other than a slightly elevated CK enzyme. She had negative workup for diabetes, prediabetes, thyroid disorder, renal and hepatic dysfunction, mononucleosis, Lyme disease, or strep throat. She is worried about  the possibility of adrenal insufficiency. She does not have classic features of the disease: Weight loss, abdominal pain, dark discoloration of skin although she does appear tanned. She also had some nausea episodes and headaches. We decided to check a cosyntropin stimulation test today, although my suspicion is fairly low. Another consideration is for osteomalacia and hypophosphatasia, which can both give generalized fatigue, muscle and joint pain. I would check a vitamin D level along with a phosphorus level today. Since her TSH was close to the upper limit of normal, I will recheck a TSH, free T4, and free T3 today to rule out hypothyroidism, especially in the setting of a slightly elevated CK. I would also check a Mg level for completeness. - I discussed with her that I cannot give her narcotic medicines, and she should discuss with Dr.Spivey (pain medicine) about this  Orders Placed This Encounter  Procedures  . TSH  . T4, free  . T3, free  . Vitamin D (25 hydroxy)  . Phosphorus  . Magnesium  . Cortisol  . ACTH  . Cortisol  . Cortisol  We will reschedule a followup appointment if the labs above are abnormal.   Office Visit on 08/06/2013  Component Date Value Range Status  . TSH 08/06/2013 0.87  0.35 - 5.50 uIU/mL Final  . Free T4 08/06/2013 0.89  0.60 - 1.60 ng/dL Final  . T3, Free 40/98/1191 2.7  2.3 - 4.2 pg/mL Final  . Phosphorus 08/06/2013 2.9  2.3 - 4.6 mg/dL Final  . Magnesium 47/82/9562 2.1  1.5 - 2.5 mg/dL Final  . Cortisol, Plasma 08/06/2013 5.2   Final   AM:  4.3 - 22.4 ug/dLPM:  3.1 - 16.7 ug/dL  . Cortisol, Plasma 08/06/2013 19.1   Final   AM:  4.3 - 22.4 ug/dLPM:  3.1 - 16.7 ug/dL  . Cortisol, Plasma 08/06/2013 27.1   Final   AM:  4.3 - 22.4 ug/dLPM:  3.1 - 16.7 ug/dL   Msg sent: Dear Miranda [name], Vitamin D is still pending, but I just wanted to let you know that your cortisol stimulation test is normal, therefore no sign of Adrenal Insufficiency. Also, your thyroid  tests are all normal. You do not have low phosphorus or low magnesium  levels. I will let you  know when the vitamin D level is back Please let me know if you have any questions. Sincerely, Carlus Pavlov MD  Vitamin D 30 ACTH returned at <5, however I suspected that this was a lab handling error (ACTH degrades if not frozen right away or if defrosts before analyzing), so I asked the patient return at 8 in the morning for a repeat ACTH level. This returned at 11, normal (drawn ay 9 am). Therefore, this confirms that there are no signs of adrenal insufficiency. I let her know.

## 2013-08-06 NOTE — Patient Instructions (Signed)
I will send you the results through MyChart. We will schedule an appointment if the results are abnormal. Start taking a Multivitamin a day. If you continue not to eat meat, add a B complex daily. Hang in there!

## 2013-08-07 ENCOUNTER — Ambulatory Visit (INDEPENDENT_AMBULATORY_CARE_PROVIDER_SITE_OTHER): Payer: BC Managed Care – PPO | Admitting: Neurology

## 2013-08-07 ENCOUNTER — Encounter: Payer: Self-pay | Admitting: Neurology

## 2013-08-07 VITALS — BP 130/88 | HR 60 | Temp 97.7°F | Resp 14 | Ht 60.0 in | Wt 203.1 lb

## 2013-08-07 DIAGNOSIS — R200 Anesthesia of skin: Secondary | ICD-10-CM

## 2013-08-07 DIAGNOSIS — R531 Weakness: Secondary | ICD-10-CM

## 2013-08-07 DIAGNOSIS — R209 Unspecified disturbances of skin sensation: Secondary | ICD-10-CM

## 2013-08-07 DIAGNOSIS — R5381 Other malaise: Secondary | ICD-10-CM

## 2013-08-07 LAB — ACTH: C206 ACTH: <5 pg/mL — ABNORMAL LOW (ref 10–46)

## 2013-08-07 NOTE — Progress Notes (Signed)
Miranda [name] MRN: [id number] DOB: 1965/10/28  Referring provider: Dr. Milinda Cave Primary care provider: Dr. Milinda Cave  Reason for consult:  Right-sided weakness  HISTORY OF PRESENT ILLNESS: Miranda [name] is a 47 year old right-handed woman with history of ADHD, depression, former alcohol abuse and fibromyalgia who presents for right sided weakness.  She is accompanied by her partner.  Records and images were personally reviewed where available.  Symptoms started this past August.  She first developed severe fatigue.  She was so tired, she was unable to perform activities she enjoys, such as gardening.  She then began noticing weakness of her right hand.  She would often drop things.  She also notes increased unsteady gait.  She feels lightheaded, but no spinning.  She feels that her legs are very heavy and difficult to move.  She feels that she has difficulty picking up her right foot.  She has had falls.  She also notes reduced appetite because of nausea.  She also notes intermittent numbness and tingling involving the toes of her right foot and the entire right hand.  She also notes shooting pain throughout her body at night.  She has diffuse body ache as well.  She has had chronic headache associated with blurred vision, which had lasted several days.  She has localized neck and back pain, but nothing radicular.  She denies recent flu-like symptoms.  She was evaluated by her PCP, Dr. Milinda Cave, who noted right arm and leg weakness on exam, as well as numbness of the right hand and foot.  A CT of the head was performed on 07/12/13, which revealed no abnormalities.    She did see rheumatology on 06/29/13.  Motor exam was reportedly normal on that visit.  XR cervical spine revealed mild degenerative changes.  XR of hands and wrists were normal.  XR of lumbar spine was normal.  Sed Rate 8, CRP 2.7,  ANA negative, connective tissue cascade negative, HLA B27 negative, TSH 3.940, CK 189.  She was diagnosed with  fibromyalgia.  She tried Lyrica, but she experienced side effects.  She is seeing pain specialist.  More recent labs for flu-like symptoms revealed negative Lyme but positive EBV panel.  NEUROLOGY CONSULTATION NOTE.  PAST MEDICAL HISTORY: Past Medical History  Diagnosis Date  . ADHD (attention deficit hyperactivity disorder)   . Depression   . GERD (gastroesophageal reflux disease)   . Alcoholism in recovery   . Fibromyalgia syndrome     Extensive rheum w/u by Dr. Kathi Ludwig (Rheum) 06/2013 --all NORMAL.  Marland Kitchen Abnormal Pap smear of cervix 2012    PAST SURGICAL HISTORY: Past Surgical History  Procedure Laterality Date  . Hernia repair      Right inguinal and right femoral hernia (2 yrs apart, both repaired by Dr. Gerrit Friends)  . Hand surgery Left 2004    Cat bite--I&D for infection  . Colposcopy      MEDICATIONS: Current Outpatient Prescriptions on File Prior to Visit  Medication Sig Dispense Refill  . amphetamine-dextroamphetamine (ADDERALL) 30 MG tablet Take 1 tablet (30 mg total) by mouth 2 (two) times daily.  60 tablet  0  . cefdinir (OMNICEF) 300 MG capsule Take 2 capsules (600 mg total) by mouth daily.  20 capsule  0  . ibuprofen (ADVIL,MOTRIN) 200 MG tablet Take 800 mg by mouth every 6 (six) hours as needed for pain.      Marland Kitchen sertraline (ZOLOFT) 100 MG tablet Take 1 tablet (100 mg total) by mouth daily.  30  tablet  6  . traZODone (DESYREL) 100 MG tablet Take 1 tablet (100 mg total) by mouth at bedtime.  30 tablet  6  . HYDROcodone-acetaminophen (NORCO/VICODIN) 5-325 MG per tablet 1-2 tabs po q6h prn pain  30 tablet  0  . ondansetron (ZOFRAN-ODT) 8 MG disintegrating tablet Take 1 tablet (8 mg total) by mouth every 8 (eight) hours as needed for nausea.  30 tablet  1   No current facility-administered medications on file prior to visit.    ALLERGIES: Allergies  Allergen Reactions  . Amoxicillin     Stomach pain    FAMILY HISTORY: Family History  Problem Relation Age of Onset  .  Diabetes Mother   . Hypertension Mother   . Heart disease Mother   . Hypertension Father   . Cancer Father     Prostate and melanoma  . Cancer Maternal Grandmother   . Heart disease Maternal Grandfather   . Heart disease Paternal Grandmother   . Heart disease Paternal Grandfather     SOCIAL HISTORY: History   Social History  . Marital Status: Single    Spouse Name: N/A    Number of Children: N/A  . Years of Education: N/A   Occupational History  . Not on file.   Social History Main Topics  . Smoking status: Former Smoker -- 1.00 packs/day for 15 years    Types: Cigarettes    Quit date: 11/01/2011  . Smokeless tobacco: Never Used  . Alcohol Use: No  . Drug Use: No  . Sexual Activity: No   Other Topics Concern  . Not on file   Social History Narrative   Lives with partner in [place].  Orig from [place].   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   Ed level: Masters Degree   Tob 10 pack yr hx quit 2 yrs ago.   Alcoholism: quit 2009.   Regular exercise: not at this time   Caffeine use: daily    REVIEW OF SYSTEMS: Constitutional: No fevers, chills, or sweats, no generalized fatigue, change in appetite Eyes: No visual changes, double vision, eye pain Ear, nose and throat: No hearing loss, ear pain, nasal congestion, sore throat Cardiovascular: No chest pain, palpitations Respiratory:  No shortness of breath at rest or with exertion, wheezes GastrointestinaI: No nausea, vomiting, diarrhea, abdominal pain, fecal incontinence Genitourinary:  No dysuria, urinary retention or frequency Musculoskeletal:  No neck pain, back pain Integumentary: No rash, pruritus, skin lesions Neurological: as above Psychiatric: No depression, insomnia, anxiety Endocrine: No palpitations, fatigue, diaphoresis, mood swings, change in appetite, change in weight, increased thirst Hematologic/Lymphatic:  No anemia, purpura, petechiae. Allergic/Immunologic: no itchy/runny eyes, nasal  congestion, recent allergic reactions, rashes  PHYSICAL EXAM: Filed Vitals:   08/07/13 0838  BP: 130/88  Pulse: 60  Temp: 97.7 F (36.5 C)  Resp: 14   General: No acute distress Head:  Normocephalic/atraumatic Neck: supple, no paraspinal tenderness, full range of motion Back: No paraspinal tenderness Heart: regular rate and rhythm Lungs: Clear to auscultation bilaterally. Vascular: No carotid bruits. Neurological Exam: Mental status: alert and oriented to person, place, and time, speech fluent and not dysarthric, language intact. Cranial nerves: CN I: not tested CN II: pupils equal, round and reactive to light, visual fields intact, fundi unremarkable. CN III, IV, VI:  full range of motion, no nystagmus, no ptosis CN V: facial sensation intact CN VII: upper and lower face symmetric CN VIII: hearing intact CN IX, X: gag intact, uvula midline CN  XI: sternocleidomastoid and trapezius muscles intact CN XII: tongue midline Bulk & Tone: normal, no fasciculations. Motor: 5/5 throughout, including intrinsic hand muscles on the right, as well as right foot dorsiflexion and plantarflexion.  She does exhibit some non-physiologic give-way weakness, but strength is intact. Sensation: Endorses reduced pinprick and vibration sensation in right leg and right arm, including the right side of torso.  It doesn't split the midline but is inconsistent. Deep Tendon Reflexes: 2+ and symmetric throughout, toes down Finger to nose testing: no dysmetria Heel to shin: endorses difficulty sliding her right foot along her left shin Gait: cautious slowed gait, but no ataxia.  Able to walk on toes and heels.  Difficulty with tandem. Romberg negative.  IMPRESSION: Multiple symptomatology.  I don't appreciate any physiologic muscle weakness on the right side.  She did exhibit positive Hoover's sign when assessing for leg weakness, which suggests non-organic etiology.  This may be psychogenic or manifestation  of fibromyalgia.  There is nothing on exam or history to suggest peripheral neurologic etiology.  PLAN: However, the right sided numbness of the face, arm and leg are subjective findings.  To be complete, I would get MRI of brain to ensure there isn't a lesion that could explain this symptomatology, such as left thalamus.  No follow up or further testing unless the MRI is remarkable.  Otherwise, I recommend continuing seeing pain specialist for   45 minutes spent with patient and partner, over 50% spent counseling and coordinating care.  Thank you for allowing me to take part in the care of this patient.  Shon Millet, DO  CC:  Nicoletta Ba, MD

## 2013-08-07 NOTE — Patient Instructions (Addendum)
I really can't find any objective abnormalities on your exam.  However, since you note right sided numbness, we will get an MRI of the brain.   Your MRI is scheduled at [facility] located at [address] (beside the hospital) on Tuesday, October 21st at 1:00 pm.  Please arrive 15 minutes prior to your appointment.     [phone].

## 2013-08-08 ENCOUNTER — Other Ambulatory Visit: Payer: Self-pay | Admitting: Family Medicine

## 2013-08-08 ENCOUNTER — Telehealth: Payer: Self-pay | Admitting: Family Medicine

## 2013-08-08 DIAGNOSIS — H6691 Otitis media, unspecified, right ear: Secondary | ICD-10-CM

## 2013-08-08 NOTE — Telephone Encounter (Signed)
She doesn't need a referral for a PCP.  Just tell her to call there and request to be seen as a new patient and she can request records to be faxed as usual.

## 2013-08-08 NOTE — Telephone Encounter (Signed)
Patient feels like it would be easier for her PCP to be in [place]. Patient is requesting a referral to Dr Felipa Eth with [facility] [phone].

## 2013-08-09 ENCOUNTER — Telehealth: Payer: Self-pay | Admitting: *Deleted

## 2013-08-09 NOTE — Telephone Encounter (Signed)
Left a detailed message on patient's phone advising no referral needed & to contact our office if she needs OV notes faxed to their office or she can wait & have [facility] fax [place] a medical release form.

## 2013-08-09 NOTE — Telephone Encounter (Deleted)
Patient is requesting refill for Omnicef. Please advise.

## 2013-08-10 ENCOUNTER — Telehealth: Payer: Self-pay | Admitting: *Deleted

## 2013-08-10 ENCOUNTER — Encounter: Payer: Self-pay | Admitting: Internal Medicine

## 2013-08-10 LAB — ACTH: C206 ACTH: 11 pg/mL (ref 10–46)

## 2013-08-10 NOTE — Telephone Encounter (Signed)
Miranda [name], I just sent her a message - there is nothing wrong that I could find in the labwork - I advised her to consider going to the ED.

## 2013-08-10 NOTE — Telephone Encounter (Signed)
Called her partner, Fannie Knee, and advised her there is nothing wrong that Dr Elvera Lennox could find in her labwork. Dr Elvera Lennox advises to consider going to the ED. She understood.

## 2013-08-10 NOTE — Telephone Encounter (Signed)
error 

## 2013-08-10 NOTE — Telephone Encounter (Signed)
Pt's partner, Janalyn Rouse, called stating that the pt is in severe stomach pain (and has been for a number of days) and wanted to know if there is anything that Dr Elvera Lennox could do? [phone]). Please advise.

## 2013-08-13 ENCOUNTER — Telehealth: Payer: Self-pay | Admitting: Neurology

## 2013-08-13 NOTE — Telephone Encounter (Signed)
Had MRI (MRI brain w/o contrast) over w/e at [facility], will fax report. We may need to cancel our MRI that is scheduled for this week. [name] S.   Per report, pt may need spinal tap based on MRI results / [name] S

## 2013-08-14 ENCOUNTER — Telehealth: Payer: Self-pay | Admitting: Neurology

## 2013-08-14 ENCOUNTER — Ambulatory Visit (HOSPITAL_COMMUNITY): Admission: RE | Admit: 2013-08-14 | Payer: BC Managed Care – PPO | Source: Ambulatory Visit

## 2013-08-14 NOTE — Telephone Encounter (Signed)
Left a message for the patient to return my call.  

## 2013-08-14 NOTE — Telephone Encounter (Signed)
From Dr. Everlena Cooper - This is regarding your telephone encounter re: Miranda [name] MRI brain. You mention that the results suggest need for possible spinal tap. I don't have the actual images, but report states that the MRI is normal. I don't think we need to proceed with our MRI. Also, for the patient, I don't have an explanation for her symptoms. At this point, I recommend continuing care with pain specialist.    I have not spoke w/ the pt regarding the MRI results, please call patient-[name]

## 2013-08-15 ENCOUNTER — Encounter: Payer: Self-pay | Admitting: Internal Medicine

## 2013-08-15 ENCOUNTER — Ambulatory Visit (INDEPENDENT_AMBULATORY_CARE_PROVIDER_SITE_OTHER): Payer: BC Managed Care – PPO | Admitting: Internal Medicine

## 2013-08-15 ENCOUNTER — Telehealth: Payer: Self-pay | Admitting: Neurology

## 2013-08-15 VITALS — BP 112/80 | HR 80 | Temp 98.3°F | Resp 16 | Ht 66.0 in | Wt 197.0 lb

## 2013-08-15 DIAGNOSIS — F329 Major depressive disorder, single episode, unspecified: Secondary | ICD-10-CM

## 2013-08-15 DIAGNOSIS — M47816 Spondylosis without myelopathy or radiculopathy, lumbar region: Secondary | ICD-10-CM | POA: Insufficient documentation

## 2013-08-15 DIAGNOSIS — M47817 Spondylosis without myelopathy or radiculopathy, lumbosacral region: Secondary | ICD-10-CM

## 2013-08-15 DIAGNOSIS — F3289 Other specified depressive episodes: Secondary | ICD-10-CM

## 2013-08-15 DIAGNOSIS — Z23 Encounter for immunization: Secondary | ICD-10-CM

## 2013-08-15 DIAGNOSIS — F32A Depression, unspecified: Secondary | ICD-10-CM

## 2013-08-15 MED ORDER — DULOXETINE HCL 30 MG PO CPEP
30.0000 mg | ORAL_CAPSULE | Freq: Every day | ORAL | Status: DC
Start: 2013-08-15 — End: 2013-11-28

## 2013-08-15 MED ORDER — SERTRALINE HCL 50 MG PO TABS: 50.0000 mg | ORAL_TABLET | Freq: Every day | ORAL | Status: DC

## 2013-08-15 MED ORDER — HYDROCODONE-ACETAMINOPHEN 10-325 MG PO TABS: 1.0000 | ORAL_TABLET | Freq: Three times a day (TID) | ORAL | Status: DC | PRN

## 2013-08-15 NOTE — Telephone Encounter (Signed)
Spoke with the patient and information was given as per Dr. Everlena Cooper below re: MRI normal. The patient states she is seeing a new PCP today (Dr. Yetta Barre) and is not going to pursue the pain management specialists at this time. I asked that she call us if needed and she states she will.         Evelina Bucy Sands at 08/14/2013  2:20 PM    Status: Signed                   From Dr. Everlena Cooper - This is regarding your telephone encounter re: [name] MRI brain. You mention that the results suggest need for possible spinal tap. I don't have the actual images, but report states that the MRI is normal. I don't think we need to proceed with our MRI. Also, for the patient, I don't have an explanation for her symptoms. At this point, I recommend continuing care with pain specialist.      I have not spoke w/ the pt regarding the MRI results, please call patient-Miranda [name]

## 2013-08-15 NOTE — Assessment & Plan Note (Signed)
She will cont hydrocodone for now for pain relief

## 2013-08-15 NOTE — Patient Instructions (Signed)

## 2013-08-15 NOTE — Assessment & Plan Note (Signed)
I think she may benefit from a transition off of sertraline over to cymbalta to help support the NE neurotransmitters She will stay on trazodone for now

## 2013-08-15 NOTE — Progress Notes (Signed)
Subjective:    Patient ID: Miranda [name], female    DOB: 02/06/1966, 47 y.o.   MRN: [id number]  Back Pain This is a recurrent problem. The current episode started more than 1 year ago. The problem occurs intermittently. The problem is unchanged. The pain is present in the lumbar spine. The quality of the pain is described as aching. The pain does not radiate. The pain is at a severity of 5/10. The pain is moderate. The pain is worse during the day. The symptoms are aggravated by bending and position. Associated symptoms include weakness (she feels weak all over). Pertinent negatives include no abdominal pain, bladder incontinence, bowel incontinence, chest pain, dysuria, fever, headaches, leg pain, numbness, paresis, paresthesias, pelvic pain, perianal numbness, tingling or weight loss. She has tried analgesics and NSAIDs for the symptoms. The treatment provided moderate relief.      Review of Systems  Constitutional: Positive for fatigue. Negative for fever, chills, weight loss, diaphoresis, activity change, appetite change and unexpected weight change.  HENT: Negative.   Eyes: Negative.   Respiratory: Negative.  Negative for apnea, cough, choking, chest tightness, shortness of breath, wheezing and stridor.   Cardiovascular: Negative.  Negative for chest pain, palpitations and leg swelling.  Gastrointestinal: Negative.  Negative for nausea, vomiting, abdominal pain, diarrhea, constipation, blood in stool and bowel incontinence.  Endocrine: Negative.   Genitourinary: Negative.  Negative for bladder incontinence, dysuria and pelvic pain.  Musculoskeletal: Positive for back pain. Negative for arthralgias, gait problem, joint swelling, myalgias, neck pain and neck stiffness.  Skin: Negative.   Allergic/Immunologic: Negative.   Neurological: Positive for weakness (she feels weak all over). Negative for dizziness, tingling, tremors, seizures, syncope, facial asymmetry, speech difficulty,  light-headedness, numbness, headaches and paresthesias.  Hematological: Negative.  Negative for adenopathy. Does not bruise/bleed easily.  Psychiatric/Behavioral: Positive for sleep disturbance and dysphoric mood (anhedonia, crying spells). Negative for suicidal ideas, hallucinations, behavioral problems, confusion, self-injury, decreased concentration and agitation. The patient is nervous/anxious. The patient is not hyperactive.        Objective:   Physical Exam  Vitals reviewed. Constitutional: She is oriented to person, place, and time. She appears well-developed and well-nourished.  Non-toxic appearance. She does not have a sickly appearance. She does not appear ill. No distress.  HENT:  Head: Normocephalic and atraumatic.  Mouth/Throat: Oropharynx is clear and moist. No oropharyngeal exudate.  Eyes: Conjunctivae are normal. Right eye exhibits no discharge. Left eye exhibits no discharge. No scleral icterus.  Neck: Normal range of motion. Neck supple. No JVD present. No tracheal deviation present. No thyromegaly present.  Cardiovascular: Normal rate, regular rhythm, normal heart sounds and intact distal pulses.  Exam reveals no gallop and no friction rub.   No murmur heard. Pulmonary/Chest: Effort normal and breath sounds normal. No stridor. No respiratory distress. She has no wheezes. She has no rales. She exhibits no tenderness.  Abdominal: Soft. Bowel sounds are normal. She exhibits no distension and no mass. There is no tenderness. There is no rebound and no guarding.  Musculoskeletal: Normal range of motion. She exhibits no edema and no tenderness.       Lumbar back: Normal. She exhibits normal range of motion, no tenderness, no bony tenderness, no swelling, no edema, no deformity, no laceration, no pain, no spasm and normal pulse.  Lymphadenopathy:    She has no cervical adenopathy.  Neurological: She is alert and oriented to person, place, and time. She has normal strength. She  displays no atrophy and  no tremor. No cranial nerve deficit or sensory deficit. She exhibits normal muscle tone. She displays a negative Romberg sign. She displays no seizure activity. Coordination normal. She displays no Babinski's sign on the right side. She displays no Babinski's sign on the left side.  Reflex Scores:      Tricep reflexes are 1+ on the right side and 1+ on the left side.      Bicep reflexes are 1+ on the right side and 1+ on the left side.      Brachioradialis reflexes are 1+ on the right side and 1+ on the left side.      Patellar reflexes are 1+ on the right side and 1+ on the left side.      Achilles reflexes are 1+ on the right side and 1+ on the left side. Skin: Skin is warm and dry. No rash noted. She is not diaphoretic. No erythema. No pallor.  Psychiatric: She has a normal mood and affect. Her speech is normal and behavior is normal. Judgment and thought content normal. Her mood appears not anxious. Her affect is not angry, not blunt and not labile. Cognition and memory are normal. She does not exhibit a depressed mood.     Lab Results  Component Value Date   WBC 8.6 07/28/2013   HGB 12.2 07/28/2013   HCT 39.5 07/28/2013   PLT 397 06/29/2013   GLUCOSE 85 07/30/2013   ALT 17 06/29/2013   AST 16 06/29/2013   NA 139 07/30/2013   K 4.2 07/30/2013   CL 105 07/30/2013   CREATININE 0.80 07/30/2013   BUN 11 07/30/2013   CO2 30 07/30/2013   TSH 0.87 08/06/2013       Assessment & Plan:

## 2013-08-15 NOTE — Telephone Encounter (Signed)
Spoke with the patient and information was given as per Dr. Everlena Cooper below re: MRI normal. The patient states she is seeing a new PCP today (Dr. Yetta Barre) and is not going to pursue the pain management specialists at this time. I asked that she call us if needed and she states she will.

## 2013-08-17 ENCOUNTER — Ambulatory Visit: Payer: BC Managed Care – PPO | Admitting: Internal Medicine

## 2013-08-29 ENCOUNTER — Other Ambulatory Visit (INDEPENDENT_AMBULATORY_CARE_PROVIDER_SITE_OTHER): Payer: BC Managed Care – PPO

## 2013-08-29 ENCOUNTER — Encounter: Payer: Self-pay | Admitting: Internal Medicine

## 2013-08-29 ENCOUNTER — Ambulatory Visit (INDEPENDENT_AMBULATORY_CARE_PROVIDER_SITE_OTHER): Payer: BC Managed Care – PPO | Admitting: Internal Medicine

## 2013-08-29 VITALS — BP 122/78 | HR 92 | Temp 97.5°F | Resp 16 | Ht 66.0 in | Wt 200.0 lb

## 2013-08-29 DIAGNOSIS — R5382 Chronic fatigue, unspecified: Secondary | ICD-10-CM

## 2013-08-29 DIAGNOSIS — M47816 Spondylosis without myelopathy or radiculopathy, lumbar region: Secondary | ICD-10-CM

## 2013-08-29 DIAGNOSIS — M47817 Spondylosis without myelopathy or radiculopathy, lumbosacral region: Secondary | ICD-10-CM

## 2013-08-29 DIAGNOSIS — R5381 Other malaise: Secondary | ICD-10-CM

## 2013-08-29 LAB — CBC WITH DIFFERENTIAL/PLATELET
Basophils Relative: 1 % (ref 0.0–3.0)
Eosinophils Absolute: 0 10*3/uL (ref 0.0–0.7)
Eosinophils Relative: 0.8 % (ref 0.0–5.0)
HCT: 35.9 % — ABNORMAL LOW (ref 36.0–46.0)
Hemoglobin: 12.3 g/dL (ref 12.0–15.0)
Lymphs Abs: 1.9 10*3/uL (ref 0.7–4.0)
MCHC: 34.2 g/dL (ref 30.0–36.0)
MCV: 89.9 fl (ref 78.0–100.0)
Monocytes Absolute: 0.5 10*3/uL (ref 0.1–1.0)
Monocytes Relative: 7.4 % (ref 3.0–12.0)
Neutro Abs: 3.9 10*3/uL (ref 1.4–7.7)
Platelets: 354 10*3/uL (ref 150.0–400.0)
WBC: 6.3 10*3/uL (ref 4.5–10.5)

## 2013-08-29 MED ORDER — OXYCODONE HCL 5 MG PO TABS: 5.0000 mg | ORAL_TABLET | ORAL | Status: DC | PRN

## 2013-08-29 MED ORDER — DOXYCYCLINE HYCLATE 100 MG PO TABS: 100.0000 mg | ORAL_TABLET | Freq: Two times a day (BID) | ORAL | Status: DC

## 2013-08-29 NOTE — Progress Notes (Signed)
Subjective:    Patient ID: Miranda [name], female    DOB: 12-30-1965, 47 y.o.   MRN: [id number]  Back Pain This is a recurrent problem. The current episode started more than 1 month ago. The problem occurs intermittently. The problem is unchanged. The pain is present in the lumbar spine. The quality of the pain is described as aching. The pain does not radiate. The pain is at a severity of 6/10. The pain is moderate. The pain is worse during the day. The symptoms are aggravated by bending, position and standing. Pertinent negatives include no abdominal pain, bladder incontinence, bowel incontinence, chest pain, dysuria, fever, headaches, leg pain, numbness, paresis, paresthesias, pelvic pain, perianal numbness, tingling, weakness or weight loss. She has tried analgesics and NSAIDs (she took her partner's oxycodone and that controls the pain better then hydrocodone) for the symptoms. The treatment provided mild relief.      Review of Systems  Constitutional: Positive for fatigue. Negative for fever, chills, weight loss, diaphoresis, activity change, appetite change and unexpected weight change.  HENT: Negative.  Negative for mouth sores, sore throat, trouble swallowing and voice change.   Eyes: Negative.   Respiratory: Negative.  Negative for cough, chest tightness, shortness of breath and stridor.   Cardiovascular: Negative.  Negative for chest pain.  Gastrointestinal: Negative.  Negative for nausea, vomiting, abdominal pain, diarrhea and bowel incontinence.  Endocrine: Negative.   Genitourinary: Negative for bladder incontinence, dysuria and pelvic pain.  Musculoskeletal: Positive for arthralgias and back pain. Negative for gait problem, joint swelling, myalgias, neck pain and neck stiffness.  Skin: Negative for color change, pallor, rash and wound.  Allergic/Immunologic: Negative.   Neurological: Negative.  Negative for tingling, tremors, weakness, light-headedness, numbness, headaches and  paresthesias.  Hematological: Negative.  Negative for adenopathy. Does not bruise/bleed easily.  Psychiatric/Behavioral: Positive for confusion, dysphoric mood and decreased concentration. Negative for suicidal ideas, hallucinations, behavioral problems, sleep disturbance, self-injury and agitation. The patient is not nervous/anxious and is not hyperactive.        Objective:   Physical Exam  Vitals reviewed. Constitutional: She is oriented to person, place, and time. She appears well-developed and well-nourished.  Non-toxic appearance. She does not have a sickly appearance. She does not appear ill. No distress.  HENT:  Head: Normocephalic and atraumatic.  Mouth/Throat: Oropharynx is clear and moist. No oropharyngeal exudate.  Eyes: Conjunctivae are normal. Right eye exhibits no discharge. Left eye exhibits no discharge. No scleral icterus.  Neck: Normal range of motion. Neck supple. No JVD present. No tracheal deviation present. No thyromegaly present.  Cardiovascular: Normal rate, regular rhythm, normal heart sounds and intact distal pulses.  Exam reveals no gallop and no friction rub.   No murmur heard. Pulmonary/Chest: Effort normal and breath sounds normal. No stridor. No respiratory distress. She has no wheezes. She has no rales. She exhibits no tenderness.  Abdominal: Soft. Bowel sounds are normal. She exhibits no distension and no mass. There is no tenderness. There is no rebound and no guarding.  Musculoskeletal: Normal range of motion. She exhibits no edema and no tenderness.  Lymphadenopathy:    She has no cervical adenopathy.  Neurological: She is oriented to person, place, and time. She has normal reflexes. She displays normal reflexes. No cranial nerve deficit. She exhibits normal muscle tone. Coordination normal.  Skin: Skin is warm and dry. No rash noted. She is not diaphoretic. No erythema. No pallor.  Psychiatric: She has a normal mood and affect. Her behavior is normal.  Judgment and thought content normal.     Lab Results  Component Value Date   WBC 8.6 07/28/2013   HGB 12.2 07/28/2013   HCT 39.5 07/28/2013   PLT 397 06/29/2013   GLUCOSE 85 07/30/2013   ALT 17 06/29/2013   AST 16 06/29/2013   NA 139 07/30/2013   K 4.2 07/30/2013   CL 105 07/30/2013   CREATININE 0.80 07/30/2013   BUN 11 07/30/2013   CO2 30 07/30/2013   TSH 0.87 08/06/2013       Assessment & Plan:

## 2013-08-29 NOTE — Progress Notes (Signed)
Pre-visit discussion using our clinic review tool. No additional management support is needed unless otherwise documented below in the visit note.  

## 2013-08-29 NOTE — Patient Instructions (Signed)
Back Pain, Adult Low back pain is very common. About 1 in 5 people have back pain.The cause of low back pain is rarely dangerous. The pain often gets better over time.About half of people with a sudden onset of back pain feel better in just 2 weeks. About 8 in 10 people feel better by 6 weeks.  CAUSES Some common causes of back pain include:  Strain of the muscles or ligaments supporting the spine.  Wear and tear (degeneration) of the spinal discs.  Arthritis.  Direct injury to the back. DIAGNOSIS Most of the time, the direct cause of low back pain is not known.However, back pain can be treated effectively even when the exact cause of the pain is unknown.Answering your caregiver's questions about your overall health and symptoms is one of the most accurate ways to make sure the cause of your pain is not dangerous. If your caregiver needs more information, he or she may order lab work or imaging tests (X-rays or MRIs).However, even if imaging tests show changes in your back, this usually does not require surgery. HOME CARE INSTRUCTIONS For many people, back pain returns.Since low back pain is rarely dangerous, it is often a condition that people can learn to manageon their own.   Remain active. It is stressful on the back to sit or stand in one place. Do not sit, drive, or stand in one place for more than 30 minutes at a time. Take short walks on level surfaces as soon as pain allows.Try to increase the length of time you walk each day.  Do not stay in bed.Resting more than 1 or 2 days can delay your recovery.  Do not avoid exercise or work.Your body is made to move.It is not dangerous to be active, even though your back may hurt.Your back will likely heal faster if you return to being active before your pain is gone.  Pay attention to your body when you bend and lift. Many people have less discomfortwhen lifting if they bend their knees, keep the load close to their bodies,and  avoid twisting. Often, the most comfortable positions are those that put less stress on your recovering back.  Find a comfortable position to sleep. Use a firm mattress and lie on your side with your knees slightly bent. If you lie on your back, put a pillow under your knees.  Only take over-the-counter or prescription medicines as directed by your caregiver. Over-the-counter medicines to reduce pain and inflammation are often the most helpful.Your caregiver may prescribe muscle relaxant drugs.These medicines help dull your pain so you can more quickly return to your normal activities and healthy exercise.  Put ice on the injured area.  Put ice in a plastic bag.  Place a towel between your skin and the bag.  Leave the ice on for 15-20 minutes, 03-04 times a day for the first 2 to 3 days. After that, ice and heat may be alternated to reduce pain and spasms.  Ask your caregiver about trying back exercises and gentle massage. This may be of some benefit.  Avoid feeling anxious or stressed.Stress increases muscle tension and can worsen back pain.It is important to recognize when you are anxious or stressed and learn ways to manage it.Exercise is a great option. SEEK MEDICAL CARE IF:  You have pain that is not relieved with rest or medicine.  You have pain that does not improve in 1 week.  You have new symptoms.  You are generally not feeling well. SEEK   IMMEDIATE MEDICAL CARE IF:   You have pain that radiates from your back into your legs.  You develop new bowel or bladder control problems.  You have unusual weakness or numbness in your arms or legs.  You develop nausea or vomiting.  You develop abdominal pain.  You feel faint. Document Released: 10/11/2005 Document Revised: 04/11/2012 Document Reviewed: 03/01/2011 [employer] Patient Information 2014 [employer].  

## 2013-08-30 ENCOUNTER — Encounter: Payer: Self-pay | Admitting: Internal Medicine

## 2013-08-30 LAB — LYME AB/WESTERN BLOT REFLEX: B burgdorferi Ab IgG+IgM: 0.53 {ISR}

## 2013-08-30 NOTE — Assessment & Plan Note (Addendum)
She reports a history of a tick bite a few weeks before these symptoms started Today I will recheck her CBC, ESR, and Lyme Ab test She will empirically try doxycycline as well A note was sent to release her from work for the next month

## 2013-08-30 NOTE — Assessment & Plan Note (Signed)
At her request the hydrocodone was changed to oxycodone

## 2013-08-31 ENCOUNTER — Encounter: Payer: Self-pay | Admitting: Internal Medicine

## 2013-09-05 ENCOUNTER — Other Ambulatory Visit: Payer: Self-pay | Admitting: Family Medicine

## 2013-09-05 ENCOUNTER — Telehealth: Payer: Self-pay | Admitting: *Deleted

## 2013-09-05 DIAGNOSIS — F909 Attention-deficit hyperactivity disorder, unspecified type: Secondary | ICD-10-CM

## 2013-09-05 NOTE — Telephone Encounter (Signed)
Pt called requesting Adderall 15mg  refill, also requesting Oxycodone 10mg  to be filled on 11.21.2014.  Please advise

## 2013-09-05 NOTE — Telephone Encounter (Signed)
Is this a change in the adderall dose?

## 2013-09-05 NOTE — Telephone Encounter (Signed)
Pt states she does not want to cut the 30mg  tab in half.

## 2013-09-06 MED ORDER — AMPHETAMINE-DEXTROAMPHETAMINE 15 MG PO TABS: 15.0000 mg | ORAL_TABLET | Freq: Two times a day (BID) | ORAL | Status: DC

## 2013-09-07 ENCOUNTER — Encounter: Payer: Self-pay | Admitting: Internal Medicine

## 2013-09-07 ENCOUNTER — Other Ambulatory Visit: Payer: Self-pay | Admitting: Internal Medicine

## 2013-09-07 DIAGNOSIS — M47816 Spondylosis without myelopathy or radiculopathy, lumbar region: Secondary | ICD-10-CM

## 2013-09-07 MED ORDER — OXYCODONE HCL 5 MG PO TABS: 5.0000 mg | ORAL_TABLET | ORAL | Status: DC | PRN

## 2013-09-07 NOTE — Telephone Encounter (Signed)
Pt called requesting an Oxycodone refill.  Please advise

## 2013-09-14 ENCOUNTER — Encounter: Payer: Self-pay | Admitting: Internal Medicine

## 2013-09-19 ENCOUNTER — Encounter: Payer: Self-pay | Admitting: Internal Medicine

## 2013-10-07 ENCOUNTER — Other Ambulatory Visit: Payer: Self-pay | Admitting: Internal Medicine

## 2013-10-07 ENCOUNTER — Encounter: Payer: Self-pay | Admitting: Internal Medicine

## 2013-10-07 DIAGNOSIS — F909 Attention-deficit hyperactivity disorder, unspecified type: Secondary | ICD-10-CM

## 2013-10-08 ENCOUNTER — Telehealth: Payer: Self-pay | Admitting: *Deleted

## 2013-10-08 DIAGNOSIS — M47816 Spondylosis without myelopathy or radiculopathy, lumbar region: Secondary | ICD-10-CM

## 2013-10-08 MED ORDER — AMPHETAMINE-DEXTROAMPHETAMINE 15 MG PO TABS: 15.0000 mg | ORAL_TABLET | Freq: Two times a day (BID) | ORAL | Status: DC

## 2013-10-08 MED ORDER — OXYCODONE HCL 5 MG PO TABS: 5.0000 mg | ORAL_TABLET | ORAL | Status: DC | PRN

## 2013-10-08 NOTE — Telephone Encounter (Signed)
Rx ready.

## 2013-10-08 NOTE — Telephone Encounter (Signed)
Pt called requesting Oxycodone refill.  Please advise 

## 2013-10-08 NOTE — Telephone Encounter (Signed)
done

## 2013-11-02 ENCOUNTER — Telehealth: Payer: Self-pay | Admitting: *Deleted

## 2013-11-02 DIAGNOSIS — M47816 Spondylosis without myelopathy or radiculopathy, lumbar region: Secondary | ICD-10-CM

## 2013-11-02 MED ORDER — OXYCODONE HCL 5 MG PO TABS: 5.0000 mg | ORAL_TABLET | ORAL | Status: DC | PRN

## 2013-11-02 NOTE — Telephone Encounter (Signed)
Left message for patient that script was available for pick up

## 2013-11-02 NOTE — Telephone Encounter (Signed)
Patient phoned requesting oxycodone 5mg  refill... States she'll be by here around 3-4 this afternoon (left message at 100).  Please advise.  CB# [phone]

## 2013-11-02 NOTE — Telephone Encounter (Signed)
done

## 2013-11-14 ENCOUNTER — Other Ambulatory Visit (HOSPITAL_COMMUNITY): Payer: Self-pay | Admitting: Surgery

## 2013-11-14 DIAGNOSIS — A6920 Lyme disease, unspecified: Secondary | ICD-10-CM

## 2013-11-18 ENCOUNTER — Other Ambulatory Visit: Payer: Self-pay | Admitting: Internal Medicine

## 2013-11-18 ENCOUNTER — Encounter: Payer: Self-pay | Admitting: Internal Medicine

## 2013-11-18 DIAGNOSIS — F909 Attention-deficit hyperactivity disorder, unspecified type: Secondary | ICD-10-CM

## 2013-11-19 MED ORDER — AMPHETAMINE-DEXTROAMPHETAMINE 15 MG PO TABS: 15.0000 mg | ORAL_TABLET | Freq: Two times a day (BID) | ORAL | Status: DC

## 2013-11-22 ENCOUNTER — Ambulatory Visit (HOSPITAL_COMMUNITY): Admission: RE | Admit: 2013-11-22 | Payer: BC Managed Care – PPO | Source: Ambulatory Visit

## 2013-11-25 ENCOUNTER — Encounter: Payer: Self-pay | Admitting: Internal Medicine

## 2013-11-28 ENCOUNTER — Encounter: Payer: Self-pay | Admitting: Internal Medicine

## 2013-11-28 ENCOUNTER — Ambulatory Visit (INDEPENDENT_AMBULATORY_CARE_PROVIDER_SITE_OTHER): Payer: BC Managed Care – PPO | Admitting: Internal Medicine

## 2013-11-28 VITALS — BP 130/72 | HR 96 | Temp 97.6°F | Resp 16 | Ht 66.0 in | Wt 191.0 lb

## 2013-11-28 DIAGNOSIS — M47817 Spondylosis without myelopathy or radiculopathy, lumbosacral region: Secondary | ICD-10-CM

## 2013-11-28 DIAGNOSIS — M47816 Spondylosis without myelopathy or radiculopathy, lumbar region: Secondary | ICD-10-CM

## 2013-11-28 DIAGNOSIS — F3289 Other specified depressive episodes: Secondary | ICD-10-CM

## 2013-11-28 DIAGNOSIS — F32A Depression, unspecified: Secondary | ICD-10-CM

## 2013-11-28 DIAGNOSIS — F909 Attention-deficit hyperactivity disorder, unspecified type: Secondary | ICD-10-CM

## 2013-11-28 DIAGNOSIS — F329 Major depressive disorder, single episode, unspecified: Secondary | ICD-10-CM

## 2013-11-28 MED ORDER — AMPHETAMINE-DEXTROAMPHETAMINE 15 MG PO TABS: 15.0000 mg | ORAL_TABLET | Freq: Two times a day (BID) | ORAL | Status: DC

## 2013-11-28 MED ORDER — VILAZODONE HCL 40 MG PO TABS: 40.0000 mg | ORAL_TABLET | Freq: Every day | ORAL | Status: DC

## 2013-11-28 MED ORDER — OXYCODONE HCL 5 MG PO TABS: 5.0000 mg | ORAL_TABLET | ORAL | Status: DC | PRN

## 2013-11-28 MED ORDER — VILAZODONE HCL 10 & 20 & 40 MG PO KIT: 1.0000 | PACK | Freq: Every day | ORAL | Status: DC

## 2013-11-28 NOTE — Assessment & Plan Note (Signed)
She will cont taking oxycodone as needed for pain

## 2013-11-28 NOTE — Progress Notes (Signed)
Subjective:    Patient ID: Miranda [name], female    DOB: [birth year], 48 y.o.   MRN: [id number]  Back Pain This is a chronic problem. The current episode started more than 1 year ago. The problem occurs constantly. The problem is unchanged. The pain is present in the lumbar spine. The quality of the pain is described as aching. The pain does not radiate. The pain is at a severity of 5/10. The pain is moderate. The pain is worse during the day. The symptoms are aggravated by bending and position. Pertinent negatives include no abdominal pain, bladder incontinence, bowel incontinence, chest pain, dysuria, fever, headaches, leg pain, numbness, paresis, paresthesias, pelvic pain, perianal numbness, tingling, weakness or weight loss. She has tried analgesics, NSAIDs and home exercises for the symptoms. The treatment provided moderate relief.      Review of Systems  Constitutional: Negative.  Negative for fever, chills, weight loss, diaphoresis, appetite change and fatigue.  HENT: Negative.   Eyes: Negative.   Respiratory: Negative.  Negative for cough, choking, chest tightness, shortness of breath and stridor.   Cardiovascular: Negative.  Negative for chest pain, palpitations and leg swelling.  Gastrointestinal: Negative.  Negative for nausea, vomiting, abdominal pain, diarrhea, constipation, blood in stool and bowel incontinence.  Endocrine: Negative.   Genitourinary: Negative.  Negative for bladder incontinence, dysuria and pelvic pain.  Musculoskeletal: Positive for back pain. Negative for arthralgias, gait problem, joint swelling, myalgias, neck pain and neck stiffness.  Skin: Negative.   Allergic/Immunologic: Negative.   Neurological: Negative.  Negative for tingling, weakness, numbness, headaches and paresthesias.  Hematological: Negative.  Negative for adenopathy. Does not bruise/bleed easily.  Psychiatric/Behavioral: Positive for dysphoric mood and decreased concentration. Negative for  suicidal ideas, hallucinations, behavioral problems, confusion, sleep disturbance, self-injury and agitation. The patient is not nervous/anxious and is not hyperactive.        Today she complains of sadness, crying spells, anhedonia, and feeling hopeless and helpless - she wants to try a different antidepressant.       Objective:   Physical Exam  Vitals reviewed. Constitutional: She appears well-developed and well-nourished. No distress.  HENT:  Head: Normocephalic and atraumatic.  Mouth/Throat: No oropharyngeal exudate.  Eyes: Conjunctivae are normal. Right eye exhibits no discharge. Left eye exhibits no discharge. No scleral icterus.  Neck: Normal range of motion. Neck supple. No JVD present. No tracheal deviation present. No thyromegaly present.  Cardiovascular: Normal rate, regular rhythm, normal heart sounds and intact distal pulses.  Exam reveals no gallop and no friction rub.   No murmur heard. Pulmonary/Chest: Effort normal and breath sounds normal. No stridor. No respiratory distress. She has no wheezes. She exhibits no tenderness.  Abdominal: Soft. Bowel sounds are normal. She exhibits no distension and no mass. There is no tenderness. There is no rebound and no guarding.  Musculoskeletal:       Lumbar back: Normal. She exhibits normal range of motion, no tenderness, no bony tenderness, no swelling, no edema, no deformity, no laceration, no pain, no spasm and normal pulse.  Lymphadenopathy:    She has no cervical adenopathy.  Neurological: She has normal strength. She displays no atrophy and no tremor. No cranial nerve deficit or sensory deficit. She exhibits normal muscle tone. She displays a negative Romberg sign. She displays no seizure activity. Coordination and gait normal.  Reflex Scores:      Tricep reflexes are 1+ on the right side and 1+ on the left side.  Bicep reflexes are 1+ on the right side and 1+ on the left side.      Brachioradialis reflexes are 1+ on the  right side and 1+ on the left side.      Patellar reflexes are 1+ on the right side and 1+ on the left side.      Achilles reflexes are 1+ on the right side and 1+ on the left side. Skin: She is not diaphoretic.  Psychiatric: Her speech is normal and behavior is normal. Judgment and thought content normal. Her mood appears not anxious. Her affect is not angry, not blunt, not labile and not inappropriate. She is not slowed and not withdrawn. Cognition and memory are normal. She exhibits a depressed mood. She expresses no homicidal and no suicidal ideation. She expresses no suicidal plans and no homicidal plans.  She is tearful today She is attentive.     Lab Results  Component Value Date   WBC 6.3 08/29/2013   HGB 12.3 08/29/2013   HCT 35.9* 08/29/2013   PLT 354.0 08/29/2013   GLUCOSE 85 07/30/2013   ALT 17 06/29/2013   AST 16 06/29/2013   NA 139 07/30/2013   K 4.2 07/30/2013   CL 105 07/30/2013   CREATININE 0.80 07/30/2013   BUN 11 07/30/2013   CO2 30 07/30/2013   TSH 0.87 08/06/2013       Assessment & Plan:

## 2013-11-28 NOTE — Patient Instructions (Signed)

## 2013-11-28 NOTE — Assessment & Plan Note (Signed)
She will cont adderall as directed

## 2013-11-28 NOTE — Assessment & Plan Note (Signed)
She will stop the cymbalta, zoloft, and trazodone and will try Viibryd Also, I have asked her to start psychotherapy

## 2013-12-06 ENCOUNTER — Other Ambulatory Visit (HOSPITAL_COMMUNITY): Payer: Self-pay | Admitting: Nurse Practitioner

## 2013-12-06 DIAGNOSIS — B999 Unspecified infectious disease: Secondary | ICD-10-CM

## 2013-12-07 ENCOUNTER — Other Ambulatory Visit (HOSPITAL_COMMUNITY): Payer: Self-pay | Admitting: Nurse Practitioner

## 2013-12-07 ENCOUNTER — Ambulatory Visit (HOSPITAL_COMMUNITY)
Admission: RE | Admit: 2013-12-07 | Discharge: 2013-12-07 | Disposition: A | Discharge status: Home / Self Care | Payer: BC Managed Care – PPO | Source: Ambulatory Visit | Attending: Nurse Practitioner | Admitting: Nurse Practitioner

## 2013-12-07 DIAGNOSIS — B999 Unspecified infectious disease: Secondary | ICD-10-CM

## 2013-12-07 DIAGNOSIS — A6920 Lyme disease, unspecified: Secondary | ICD-10-CM | POA: Insufficient documentation

## 2013-12-07 NOTE — Procedures (Signed)
Successful placement of single lumen power injectable PICC line to right brachial vein. Length 38cm Tip at lower SVC/RA No complications Ready for use.  Pattricia BossKoreen Traevon Meiring PA-C Interventional Radiology  12/07/13  11:36 AM

## 2013-12-10 ENCOUNTER — Encounter: Payer: Self-pay | Admitting: Internal Medicine

## 2013-12-26 ENCOUNTER — Ambulatory Visit: Payer: BC Managed Care – PPO | Admitting: Internal Medicine

## 2013-12-26 DIAGNOSIS — Z0289 Encounter for other administrative examinations: Secondary | ICD-10-CM

## 2013-12-27 ENCOUNTER — Encounter: Payer: Self-pay | Admitting: Internal Medicine

## 2014-07-09 ENCOUNTER — Other Ambulatory Visit (HOSPITAL_COMMUNITY): Payer: Self-pay | Admitting: Nurse Practitioner

## 2014-07-09 ENCOUNTER — Ambulatory Visit (HOSPITAL_COMMUNITY)
Admission: RE | Admit: 2014-07-09 | Discharge: 2014-07-09 | Disposition: A | Discharge status: Home / Self Care | Payer: BC Managed Care – PPO | Source: Ambulatory Visit | Attending: Nurse Practitioner | Admitting: Nurse Practitioner

## 2014-07-09 DIAGNOSIS — A6920 Lyme disease, unspecified: Secondary | ICD-10-CM | POA: Diagnosis not present

## 2014-07-09 DIAGNOSIS — Z452 Encounter for adjustment and management of vascular access device: Secondary | ICD-10-CM | POA: Diagnosis not present

## 2014-07-09 MED ORDER — LIDOCAINE HCL 1 % IJ SOLN
INTRAMUSCULAR | Status: DC
Start: 2014-07-09 — End: 2014-07-10
  Filled 2014-07-09: qty 20

## 2014-07-09 NOTE — Procedures (Signed)
LEFT arm PowerPICC placed under US and fluoroscopy No ptx on spot chest radiograph. No complication No blood loss. See complete dictation in [facility] PACS.  

## 2014-07-25 ENCOUNTER — Inpatient Hospital Stay (HOSPITAL_COMMUNITY)
Admission: EM | Admit: 2014-07-25 | Discharge: 2014-07-28 | DRG: 864 | Disposition: A | Discharge status: Home / Self Care | Payer: BC Managed Care – PPO | Attending: Internal Medicine | Admitting: Internal Medicine

## 2014-07-25 ENCOUNTER — Encounter (HOSPITAL_COMMUNITY): Payer: Self-pay | Admitting: Emergency Medicine

## 2014-07-25 ENCOUNTER — Emergency Department (HOSPITAL_COMMUNITY): Payer: BC Managed Care – PPO

## 2014-07-25 DIAGNOSIS — R509 Fever, unspecified: Secondary | ICD-10-CM | POA: Diagnosis not present

## 2014-07-25 DIAGNOSIS — Z8619 Personal history of other infectious and parasitic diseases: Secondary | ICD-10-CM

## 2014-07-25 DIAGNOSIS — N179 Acute kidney failure, unspecified: Secondary | ICD-10-CM | POA: Diagnosis present

## 2014-07-25 DIAGNOSIS — R4182 Altered mental status, unspecified: Secondary | ICD-10-CM | POA: Diagnosis present

## 2014-07-25 DIAGNOSIS — R059 Cough, unspecified: Secondary | ICD-10-CM

## 2014-07-25 DIAGNOSIS — F32A Depression, unspecified: Secondary | ICD-10-CM | POA: Diagnosis present

## 2014-07-25 DIAGNOSIS — D62 Acute posthemorrhagic anemia: Secondary | ICD-10-CM | POA: Diagnosis present

## 2014-07-25 DIAGNOSIS — F1021 Alcohol dependence, in remission: Secondary | ICD-10-CM

## 2014-07-25 DIAGNOSIS — A6920 Lyme disease, unspecified: Secondary | ICD-10-CM | POA: Diagnosis present

## 2014-07-25 DIAGNOSIS — Z880 Allergy status to penicillin: Secondary | ICD-10-CM

## 2014-07-25 DIAGNOSIS — D72829 Elevated white blood cell count, unspecified: Secondary | ICD-10-CM | POA: Diagnosis present

## 2014-07-25 DIAGNOSIS — F909 Attention-deficit hyperactivity disorder, unspecified type: Secondary | ICD-10-CM

## 2014-07-25 DIAGNOSIS — R031 Nonspecific low blood-pressure reading: Secondary | ICD-10-CM

## 2014-07-25 DIAGNOSIS — M47816 Spondylosis without myelopathy or radiculopathy, lumbar region: Secondary | ICD-10-CM

## 2014-07-25 DIAGNOSIS — R05 Cough: Secondary | ICD-10-CM

## 2014-07-25 DIAGNOSIS — F329 Major depressive disorder, single episode, unspecified: Secondary | ICD-10-CM

## 2014-07-25 DIAGNOSIS — Z87891 Personal history of nicotine dependence: Secondary | ICD-10-CM

## 2014-07-25 HISTORY — DX: Lyme disease, unspecified: A69.20

## 2014-07-25 LAB — URINALYSIS, ROUTINE W REFLEX MICROSCOPIC
Bilirubin Urine: NEGATIVE
Glucose, UA: NEGATIVE mg/dL
HGB URINE DIPSTICK: NEGATIVE
Ketones, ur: NEGATIVE mg/dL
LEUKOCYTES UA: NEGATIVE
NITRITE: NEGATIVE
PROTEIN: NEGATIVE mg/dL
SPECIFIC GRAVITY, URINE: 1.022 (ref 1.005–1.030)
Urobilinogen, UA: 0.2 mg/dL (ref 0.0–1.0)
pH: 5.5 (ref 5.0–8.0)

## 2014-07-25 LAB — CBC
HCT: 34.1 % — ABNORMAL LOW (ref 36.0–46.0)
HEMOGLOBIN: 11.3 g/dL — AB (ref 12.0–15.0)
MCH: 30.4 pg (ref 26.0–34.0)
MCHC: 33.1 g/dL (ref 30.0–36.0)
MCV: 91.7 fL (ref 78.0–100.0)
Platelets: 350 10*3/uL (ref 150–400)
RBC: 3.72 MIL/uL — ABNORMAL LOW (ref 3.87–5.11)
RDW: 12.9 % (ref 11.5–15.5)
WBC: 11.9 10*3/uL — AB (ref 4.0–10.5)

## 2014-07-25 LAB — COMPREHENSIVE METABOLIC PANEL
ALT: 15 U/L (ref 0–35)
AST: 20 U/L (ref 0–37)
Albumin: 3.3 g/dL — ABNORMAL LOW (ref 3.5–5.2)
Alkaline Phosphatase: 69 U/L (ref 39–117)
Anion gap: 12 (ref 5–15)
BUN: 14 mg/dL (ref 6–23)
CALCIUM: 8.2 mg/dL — AB (ref 8.4–10.5)
CO2: 22 mEq/L (ref 19–32)
Chloride: 99 mEq/L (ref 96–112)
Creatinine, Ser: 1.11 mg/dL — ABNORMAL HIGH (ref 0.50–1.10)
GFR calc non Af Amer: 58 mL/min — ABNORMAL LOW (ref >90)
GFR, EST AFRICAN AMERICAN: 67 mL/min — AB (ref >90)
GLUCOSE: 106 mg/dL — AB (ref 70–99)
POTASSIUM: 3.7 meq/L (ref 3.7–5.3)
Sodium: 133 mEq/L — ABNORMAL LOW (ref 137–147)
Total Bilirubin: 0.3 mg/dL (ref 0.3–1.2)
Total Protein: 6.7 g/dL (ref 6.0–8.3)

## 2014-07-25 LAB — I-STAT CG4 LACTIC ACID, ED: LACTIC ACID, VENOUS: 0.81 mmol/L (ref 0.5–2.2)

## 2014-07-25 LAB — I-STAT TROPONIN, ED: Troponin i, poc: 0.01 ng/mL (ref 0.00–0.08)

## 2014-07-25 MED ORDER — CETYLPYRIDINIUM CHLORIDE 0.05 % MT LIQD
7.0000 mL | Freq: Two times a day (BID) | OROMUCOSAL | Status: DC
  Administered 2014-07-26 – 2014-07-28 (×3): 7 mL via OROMUCOSAL

## 2014-07-25 MED ORDER — DEXTROSE 5 % IV SOLN: 1.0000 g | Freq: Once | INTRAVENOUS | Status: DC

## 2014-07-25 MED ORDER — DEXTROSE 5 % IV SOLN
250.0000 mg | INTRAVENOUS | Status: DC
  Administered 2014-07-25 – 2014-07-27 (×3): 250 mg via INTRAVENOUS
  Filled 2014-07-25 (×3): qty 250

## 2014-07-25 MED ORDER — AZITHROMYCIN 500 MG IV SOLR: 500.0000 mg | Freq: Once | INTRAVENOUS | Status: DC

## 2014-07-25 MED ORDER — HYDROCODONE-ACETAMINOPHEN 5-325 MG PO TABS
1.0000 | ORAL_TABLET | ORAL | Status: DC | PRN
  Administered 2014-07-25: 1 via ORAL
  Administered 2014-07-26 – 2014-07-27 (×4): 2 via ORAL
  Administered 2014-07-27: 1 via ORAL
  Administered 2014-07-27 – 2014-07-28 (×4): 2 via ORAL
  Administered 2014-07-28: 1 via ORAL
  Filled 2014-07-25: qty 1
  Filled 2014-07-25 (×6): qty 2
  Filled 2014-07-25: qty 1
  Filled 2014-07-25 (×4): qty 2

## 2014-07-25 MED ORDER — SODIUM CHLORIDE 0.9 % IV SOLN
INTRAVENOUS | Status: DC
  Administered 2014-07-25 – 2014-07-26 (×3): via INTRAVENOUS

## 2014-07-25 MED ORDER — SODIUM CHLORIDE 0.9 % IV BOLUS (SEPSIS)
500.0000 mL | Freq: Once | INTRAVENOUS | Status: AC
  Administered 2014-07-25: 500 mL via INTRAVENOUS

## 2014-07-25 MED ORDER — VITAMIN B-1 100 MG PO TABS
100.0000 mg | ORAL_TABLET | Freq: Every day | ORAL | Status: DC
  Administered 2014-07-26 – 2014-07-28 (×3): 100 mg via ORAL
  Filled 2014-07-25 (×3): qty 1

## 2014-07-25 MED ORDER — ONDANSETRON HCL 4 MG PO TABS
4.0000 mg | ORAL_TABLET | Freq: Four times a day (QID) | ORAL | Status: DC | PRN
Start: 2014-07-25 — End: 2014-07-28
  Administered 2014-07-26 (×3): 4 mg via ORAL
  Filled 2014-07-25 (×3): qty 1

## 2014-07-25 MED ORDER — ACETAMINOPHEN 325 MG PO TABS
650.0000 mg | ORAL_TABLET | Freq: Four times a day (QID) | ORAL | Status: DC | PRN
  Administered 2014-07-26 – 2014-07-27 (×2): 650 mg via ORAL
  Filled 2014-07-25 (×2): qty 2

## 2014-07-25 MED ORDER — ADULT MULTIVITAMIN W/MINERALS CH
1.0000 | ORAL_TABLET | Freq: Every day | ORAL | Status: DC
  Administered 2014-07-26 – 2014-07-28 (×3): 1 via ORAL
  Filled 2014-07-25 (×3): qty 1

## 2014-07-25 MED ORDER — HEPARIN SODIUM (PORCINE) 5000 UNIT/ML IJ SOLN
5000.0000 [IU] | Freq: Three times a day (TID) | INTRAMUSCULAR | Status: DC
  Administered 2014-07-25 – 2014-07-28 (×8): 5000 [IU] via SUBCUTANEOUS
  Filled 2014-07-25 (×11): qty 1

## 2014-07-25 MED ORDER — ALBUTEROL SULFATE (2.5 MG/3ML) 0.083% IN NEBU
5.0000 mg | INHALATION_SOLUTION | Freq: Once | RESPIRATORY_TRACT | Status: AC
  Administered 2014-07-25: 5 mg via RESPIRATORY_TRACT
  Filled 2014-07-25: qty 6

## 2014-07-25 MED ORDER — DEXTROSE 5 % IV SOLN
1.0000 g | INTRAVENOUS | Status: DC
  Administered 2014-07-25 – 2014-07-27 (×3): 1 g via INTRAVENOUS
  Filled 2014-07-25 (×3): qty 10

## 2014-07-25 MED ORDER — ACETAMINOPHEN 650 MG RE SUPP: 650.0000 mg | Freq: Four times a day (QID) | RECTAL | Status: DC | PRN

## 2014-07-25 MED ORDER — FOLIC ACID 1 MG PO TABS
1.0000 mg | ORAL_TABLET | Freq: Every day | ORAL | Status: DC
  Administered 2014-07-26 – 2014-07-28 (×3): 1 mg via ORAL
  Filled 2014-07-25 (×3): qty 1

## 2014-07-25 MED ORDER — SODIUM CHLORIDE 0.9 % IV SOLN: INTRAVENOUS | Status: DC

## 2014-07-25 MED ORDER — ASPIRIN EC 81 MG PO TBEC
81.0000 mg | DELAYED_RELEASE_TABLET | Freq: Every day | ORAL | Status: DC
  Administered 2014-07-26 – 2014-07-28 (×3): 81 mg via ORAL
  Filled 2014-07-25 (×3): qty 1

## 2014-07-25 MED ORDER — ONDANSETRON HCL 4 MG/2ML IJ SOLN
4.0000 mg | Freq: Four times a day (QID) | INTRAMUSCULAR | Status: DC | PRN
  Administered 2014-07-27 – 2014-07-28 (×5): 4 mg via INTRAVENOUS
  Filled 2014-07-25 (×5): qty 2

## 2014-07-25 MED ORDER — SODIUM CHLORIDE 0.9 % IV BOLUS (SEPSIS)
1000.0000 mL | Freq: Once | INTRAVENOUS | Status: AC
  Administered 2014-07-25: 1000 mL via INTRAVENOUS

## 2014-07-25 NOTE — ED Notes (Signed)
Steinl, EDP made aware of patient CG4 Lactic results.

## 2014-07-25 NOTE — H&P (Signed)
[facility] History and Physical  Miranda [name] VWU:[id number] DOB: [birth year] DOA: 07/25/2014  Referring physician: Cathren Laine, MD PCP: Elie Goody, NP   Chief Complaint: Fever  HPI: Miranda [name] is a 48 y.o. female presents with fever. Patient states that today she developed a fever to about 105F. Patient states that she was her usual state until the fever. Her partner states that she did take some tylenol and now the fever seems to be coming down. Patient has a complex history with chronic lyme disease and she is being treated for this in [place]. Patient did not have her list of medications with her at this time. Patient has associated chills. She has no rash noted. Patient has been more confused also according to her partner. Patient has noted cough and some congestion also with no hemoptysis. No chest pain is noted   Review of Systems:  Constitutional:  No weight loss, night sweats, ++Fevers, ++chills, ++fatigue.  HEENT:  No headaches  Cardio-vascular:  No chest pain, swelling in lower extremities  GI:  No heartburn, indigestion, abdominal pain, nausea, vomiting, diarrhea  Resp:  No shortness of breath with exertion or at rest. No coughing up of blood Skin:  no rash or lesions.  GU:  no dysuria, change in color of urine.  Musculoskeletal:  No joint pain or swelling.  Psych:  ++ change in mood or affect.   Past Medical History  Diagnosis Date  . ADHD (attention deficit hyperactivity disorder)   . Depression   . GERD (gastroesophageal reflux disease)   . Alcoholism in recovery   . Fibromyalgia syndrome     Extensive rheum w/u by Dr. Kathi Ludwig (Rheum) 06/2013 --all NORMAL.  Marland Kitchen Abnormal Pap smear of cervix 2012  . Lyme disease    Past Surgical History  Procedure Laterality Date  . Hernia repair      Right inguinal and right femoral hernia (2 yrs apart, both repaired by Dr. Gerrit Friends)  . Hand surgery Left 2004    Cat bite--I&D for infection  . Colposcopy      Social History:  reports that she quit smoking about 2 years ago. Her smoking use included Cigarettes. She has a 15 pack-year smoking history. She has never used smokeless tobacco. She reports that she does not drink alcohol or use illicit drugs.  Allergies  Allergen Reactions  . Amoxicillin     Stomach pain    Family History  Problem Relation Age of Onset  . Diabetes Mother   . Hypertension Mother   . Heart disease Mother   . Hypertension Father   . Cancer Father     Prostate and melanoma  . Cancer Maternal Grandmother   . Heart disease Maternal Grandfather   . Heart disease Paternal Grandmother   . Heart disease Paternal Grandfather      Prior to Admission medications   Medication Sig Start Date End Date Taking? Authorizing Provider  OxyCODONE (OXYCONTIN) 20 mg T12A 12 hr tablet Take 20 mg by mouth every 12 (twelve) hours.   Yes Historical Provider, MD   Physical Exam: Filed Vitals:   07/25/14 1920 07/25/14 1923 07/25/14 2028 07/25/14 2122  BP: 109/63  97/51 100/51  Pulse: 111  60 61  Temp:  101.6 F (38.7 C) 99.8 F (37.7 C)   TempSrc:  Rectal Oral   Resp: 14     SpO2: 96%  95% 95%    Wt Readings from Last 3 Encounters:  11/28/13 86.637 kg (191 lb)  08/29/13 90.719 kg (200 lb)  08/15/13 89.359 kg (197 lb)    General:  Appears calm and somnolent Eyes: PERRL, normal lids, irises & conjunctiva ENT: grossly normal hearing, lips & tongue Neck: no LAD, masses or thyromegaly Cardiovascular: RRR, no m/r/g. No LE edema. Respiratory: CTA bilaterally, no w/r/r. Normal respiratory effort. Abdomen: soft, ntnd Skin: no rash or induration seen on limited exam Musculoskeletal: grossly normal tone BUE/BLE Psychiatric: grossly flat affect Neurologic: grossly non-focal. But she is somnolent          Labs on Admission:  Basic Metabolic Panel:  Recent Labs Lab 07/25/14 1909  NA 133*  K 3.7  CL 99  CO2 22  GLUCOSE 106*  BUN 14  CREATININE 1.11*  CALCIUM 8.2*    Liver Function Tests:  Recent Labs Lab 07/25/14 1909  AST 20  ALT 15  ALKPHOS 69  BILITOT 0.3  PROT 6.7  ALBUMIN 3.3*   No results found for this basename: LIPASE, AMYLASE,  in the last 168 hours No results found for this basename: AMMONIA,  in the last 168 hours CBC:  Recent Labs Lab 07/25/14 1907  WBC 11.9*  HGB 11.3*  HCT 34.1*  MCV 91.7  PLT 350   Cardiac Enzymes: No results found for this basename: CKTOTAL, CKMB, CKMBINDEX, TROPONINI,  in the last 168 hours  BNP (last 3 results) No results found for this basename: PROBNP,  in the last 8760 hours CBG: No results found for this basename: GLUCAP,  in the last 168 hours  Radiological Exams on Admission: Dg Chest 2 View (if Patient Has Fever And/or Copd)  07/25/2014   CLINICAL DATA:  48 year old female with cough and fever.  EXAM: CHEST - 2 VIEW  COMPARISON:  Plain film 06/29/2013  FINDINGS: Cardiomediastinal silhouette similar given the differences in the patient positioning/projection.  No confluent airspace disease, pneumothorax, or pleural effusion.  No displaced fracture.  Left-sided PICC, appears to terminate at the cavoatrial junction.  IMPRESSION: No radiographic evidence of acute cardiopulmonary disease.  Left-sided PICC.  Signed,  Yvone NeuJaime S. Loreta AveWagner, DO  [facility]  [facility]   Electronically Signed   By: Gilmer MorJaime  Wagner O.D.   On: 07/25/2014 20:05    Assessment/Plan Principal Problem:   Fever Active Problems:   Depression   History of Lyme disease   1. Fever -she does have cough with a negative CXR noted.  -will start on rocephin and azithromycin -repeat CXR in am -cultures ordered await results  2. AMS -likely a combination of fevers and also her narcotics -will monitor -baseline is alert and oriented per family  3. History of lyme Disease -not sure of what she is on. -family is going to bring in her medications to review     Code Status: Full  Code (must indicate code status--if unknown or must be presumed, indicate so) DVT Prophylaxis:Heparin Family Communication: Mother (indicate person spoken with, if applicable, with phone number if by telephone) Disposition Plan: Home (indicate anticipated LOS)  Time spent: 70min  [facility] CenterKHAN,Clytee Heinrich A [facility] Pager [phone]

## 2014-07-25 NOTE — ED Provider Notes (Signed)
CSN: [id number]     Arrival date & time 07/25/14  1835 History   First MD Initiated Contact with Patient 07/25/14 1855     Chief Complaint  Patient presents with  . Cough  . Fever     (Consider location/radiation/quality/duration/timing/severity/associated sxs/prior Treatment) Patient is a 48 y.o. female presenting with cough and fever. The history is provided by the patient, a relative and a significant other.  Cough Associated symptoms: fever   Fever Associated symptoms: cough   pt with hx 'chronic lyme disease', c/o fever and non prod cough in the past day.  t105 at home.  partner has noted loud, congested sounded breathing today. No sore throat, runny nose, or other uri c/o. No headache, no neck pain or stiffness. No cp. No abd pain or nvd. No dysuria or gu c/o. No rash.  Has left upper ext picc line x 1 month. No pain, erythema or drainage at site.  States chronic lyme diagnosis was made earlier this year. On multiple different abx since then, current on septra and cipro. States in preceding 4-5 years had variety symptoms including myalgias and athralgias, and periods confusion, for which had been diagnosed w fibromyalgia.  States also had extensive neuro eval, and was told did not have MS.  States eventually a lyme expert diagnosed w chronic lyme disease although states initial tests negative. Denies any period of acute febrile illness w rash or skin lesion prior to dx chronic lyme disease  Past Medical History  Diagnosis Date  . ADHD (attention deficit hyperactivity disorder)   . Depression   . GERD (gastroesophageal reflux disease)   . Alcoholism in recovery   . Fibromyalgia syndrome     Extensive rheum w/u by Dr. Dossie Der (Rheum) 06/2013 --all NORMAL.  Marland Kitchen Abnormal Pap smear of cervix 2012  . Chronic lung disease   . Lyme disease    Past Surgical History  Procedure Laterality Date  . Hernia repair      Right inguinal and right femoral hernia (2 yrs apart, both repaired by Dr.  Harlow Asa)  . Hand surgery Left 2004    Cat bite--I&D for infection  . Colposcopy     Family History  Problem Relation Age of Onset  . Diabetes Mother   . Hypertension Mother   . Heart disease Mother   . Hypertension Father   . Cancer Father     Prostate and melanoma  . Cancer Maternal Grandmother   . Heart disease Maternal Grandfather   . Heart disease Paternal Grandmother   . Heart disease Paternal Grandfather    History  Substance Use Topics  . Smoking status: Former Smoker -- 1.00 packs/day for 15 years    Types: Cigarettes    Quit date: 11/01/2011  . Smokeless tobacco: Never Used  . Alcohol Use: No   OB History   Grav Para Term Preterm Abortions TAB SAB Ect Mult Living                 Review of Systems  Constitutional: Positive for fever.  Respiratory: Positive for cough.       Allergies  Amoxicillin  Home Medications   Prior to Admission medications   Medication Sig Start Date End Date Taking? Authorizing Provider  amphetamine-dextroamphetamine (ADDERALL) 15 MG tablet Take 1 tablet (15 mg total) by mouth 2 (two) times daily. 11/28/13   Janith Lima, MD  ibuprofen (ADVIL,MOTRIN) 200 MG tablet Take 800 mg by mouth every 6 (six) hours as needed for pain.  Historical Provider, MD  oxyCODONE (OXY IR/ROXICODONE) 5 MG immediate release tablet Take 1 tablet (5 mg total) by mouth every 4 (four) hours as needed for severe pain. Fill on or after 01/26/14 11/28/13   Janith Lima, MD  Vilazodone HCl (VIIBRYD) 10 & 20 & 40 MG KIT Take 1 tablet by mouth daily. 11/28/13   Janith Lima, MD  Vilazodone HCl (VIIBRYD) 40 MG TABS Take 1 tablet (40 mg total) by mouth daily. 11/28/13   Janith Lima, MD   BP 115/63  Pulse 126  Temp(Src) 100.1 F (37.8 C) (Oral)  Resp 24  SpO2 93%  LMP 07/02/2014 Physical Exam  Nursing note and vitals reviewed. Constitutional: She is oriented to person, place, and time. She appears well-developed and well-nourished.  Febrile tachycardic.    HENT:  Head: Atraumatic.  Mouth/Throat: Oropharynx is clear and moist.  Eyes: Conjunctivae and EOM are normal. Pupils are equal, round, and reactive to light. No scleral icterus.  Neck: Normal range of motion. Neck supple. No tracheal deviation present. No thyromegaly present.  No stiffness or rigidity  Cardiovascular: Regular rhythm, normal heart sounds and intact distal pulses.  Exam reveals no gallop and no friction rub.   No murmur heard. Pulmonary/Chest: Effort normal. No respiratory distress.  Upper resp congestion, rhonchi.   Abdominal: Soft. Normal appearance and bowel sounds are normal. She exhibits no distension and no mass. There is no tenderness. There is no rebound and no guarding.  Genitourinary:  No cva tenderness  Musculoskeletal: She exhibits no edema.  Good rom bil ext. No focal bony or joint pain or swelling. picc lines LUE, without sign of infection.   Lymphadenopathy:    She has no cervical adenopathy.  Neurological: She is alert and oriented to person, place, and time.  Skin: Skin is warm and dry. No rash noted.  Psychiatric: She has a normal mood and affect.    ED Course  Procedures (including critical care time) Labs Review  Results for orders placed during the hospital encounter of 07/25/14  CBC      Result Value Ref Range   WBC 11.9 (*) 4.0 - 10.5 K/uL   RBC 3.72 (*) 3.87 - 5.11 MIL/uL   Hemoglobin 11.3 (*) 12.0 - 15.0 g/dL   HCT 34.1 (*) 36.0 - 46.0 %   MCV 91.7  78.0 - 100.0 fL   MCH 30.4  26.0 - 34.0 pg   MCHC 33.1  30.0 - 36.0 g/dL   RDW 12.9  11.5 - 15.5 %   Platelets 350  150 - 400 K/uL  URINALYSIS, ROUTINE W REFLEX MICROSCOPIC      Result Value Ref Range   Color, Urine AMBER (*) YELLOW   APPearance CLEAR  CLEAR   Specific Gravity, Urine 1.022  1.005 - 1.030   pH 5.5  5.0 - 8.0   Glucose, UA NEGATIVE  NEGATIVE mg/dL   Hgb urine dipstick NEGATIVE  NEGATIVE   Bilirubin Urine NEGATIVE  NEGATIVE   Ketones, ur NEGATIVE  NEGATIVE mg/dL    Protein, ur NEGATIVE  NEGATIVE mg/dL   Urobilinogen, UA 0.2  0.0 - 1.0 mg/dL   Nitrite NEGATIVE  NEGATIVE   Leukocytes, UA NEGATIVE  NEGATIVE  COMPREHENSIVE METABOLIC PANEL      Result Value Ref Range   Sodium 133 (*) 137 - 147 mEq/L   Potassium 3.7  3.7 - 5.3 mEq/L   Chloride 99  96 - 112 mEq/L   CO2 22  19 - 32  mEq/L   Glucose, Bld 106 (*) 70 - 99 mg/dL   BUN 14  6 - 23 mg/dL   Creatinine, Ser 1.11 (*) 0.50 - 1.10 mg/dL   Calcium 8.2 (*) 8.4 - 10.5 mg/dL   Total Protein 6.7  6.0 - 8.3 g/dL   Albumin 3.3 (*) 3.5 - 5.2 g/dL   AST 20  0 - 37 U/L   ALT 15  0 - 35 U/L   Alkaline Phosphatase 69  39 - 117 U/L   Total Bilirubin 0.3  0.3 - 1.2 mg/dL   GFR calc non Af Amer 58 (*) >90 mL/min   GFR calc Af Amer 67 (*) >90 mL/min   Anion gap 12  5 - 15  I-STAT TROPOININ, ED      Result Value Ref Range   Troponin i, poc 0.01  0.00 - 0.08 ng/mL   Comment 3           I-STAT CG4 LACTIC ACID, ED      Result Value Ref Range   Lactic Acid, Venous 0.81  0.5 - 2.2 mmol/L   Dg Chest 2 View (if Patient Has Fever And/or Copd)  07/25/2014   CLINICAL DATA:  48 year old female with cough and fever.  EXAM: CHEST - 2 VIEW  COMPARISON:  Plain film 06/29/2013  FINDINGS: Cardiomediastinal silhouette similar given the differences in the patient positioning/projection.  No confluent airspace disease, pneumothorax, or pleural effusion.  No displaced fracture.  Left-sided PICC, appears to terminate at the cavoatrial junction.  IMPRESSION: No radiographic evidence of acute cardiopulmonary disease.  Left-sided PICC.  Signed,  Dulcy Fanny. Earleen Newport, DO  [facility]  [facility]   Electronically Signed   By: Corrie Mckusick O.D.   On: 07/25/2014 20:05   Ir Fluoro Guide Cv Line Left  07/09/2014   CLINICAL DATA:  Chronic Lyme disease. Needs continued venous access for antibiotics. Previously placed right PICC line was partially retracted. Patient pushed it back in but will not  aspirate.  EXAM: PICC PLACEMENT WITH ULTRASOUND AND FLUOROSCOPY  FLUOROSCOPY TIME:  12 seconds  TECHNIQUE: After written informed consent was obtained, patient was placed in the supine position on angiographic table. Because of concerns of possible line Infection on the right given the manipulation, I elected to place a new catheter on the left. Patency of the left basilic vein was confirmed with ultrasound with image documentation. An appropriate skin site was determined. Skin site was marked. Region was prepped using maximum barrier technique including cap and mask, sterile gown, sterile gloves, large sterile sheet, and Chlorhexidine as cutaneous antisepsis. The region was infiltrated locally with 1% lidocaine. Under real-time ultrasound guidance, the left basilic vein was accessed with a 21 gauge micropuncture needle; the needle tip within the vein was confirmed with ultrasound image documentation. Needle exchanged over a 018 guidewire for a peel-away sheath, through which a 5-French Single-lumen power injectable PICC trimmed to 47cm was advanced, positioned with its tip near the cavoatrial junction. Spot chest radiograph confirms appropriate catheter position. Catheter was flushed per protocol and secured externally with 0-Prolene sutures. The right arm PICC was removed. The patient tolerated procedure well, with no immediate complication.  IMPRESSION: Technically successful LEFT five French single lumen power injectable PICC placement   Electronically Signed   By: Arne Cleveland M.D.   On: 07/09/2014 16:41   Ir US Guide Vasc Access Left  07/09/2014   CLINICAL DATA:  Chronic Lyme disease. Needs continued venous access for  antibiotics. Previously placed right PICC line was partially retracted. Patient pushed it back in but will not aspirate.  EXAM: PICC PLACEMENT WITH ULTRASOUND AND FLUOROSCOPY  FLUOROSCOPY TIME:  12 seconds  TECHNIQUE: After written informed consent was obtained, patient was placed in the  supine position on angiographic table. Because of concerns of possible line Infection on the right given the manipulation, I elected to place a new catheter on the left. Patency of the left basilic vein was confirmed with ultrasound with image documentation. An appropriate skin site was determined. Skin site was marked. Region was prepped using maximum barrier technique including cap and mask, sterile gown, sterile gloves, large sterile sheet, and Chlorhexidine as cutaneous antisepsis. The region was infiltrated locally with 1% lidocaine. Under real-time ultrasound guidance, the left basilic vein was accessed with a 21 gauge micropuncture needle; the needle tip within the vein was confirmed with ultrasound image documentation. Needle exchanged over a 018 guidewire for a peel-away sheath, through which a 5-French Single-lumen power injectable PICC trimmed to 47cm was advanced, positioned with its tip near the cavoatrial junction. Spot chest radiograph confirms appropriate catheter position. Catheter was flushed per protocol and secured externally with 0-Prolene sutures. The right arm PICC was removed. The patient tolerated procedure well, with no immediate complication.  IMPRESSION: Technically successful LEFT five French single lumen power injectable PICC placement   Electronically Signed   By: Arne Cleveland M.D.   On: 07/09/2014 16:41     MDM  Iv ns. Labs.  Cultures.  Reviewed nursing notes and prior charts for additional history.   Cxr.  Mild wheezing. Alb neb.  Pt w high fevers, t oral at home 105+.  +coughing, and sounds very congested on lung exam +rhonchi.  Although no infil on cxr, suspect resp inf, bo soft esp compared to priors (additional iv ns bolus) pt also has picc/cxs pending - will contact med service re admit/obs.     Mirna Mires, MD 07/25/14 2106

## 2014-07-25 NOTE — ED Notes (Addendum)
Reports since this am she has cough and has been very sleepy. Temperature at home was 105.29F at 1430. Family gave tylenol. Family reports fever was 1029F prior to arrival. Pt has audible rhonchi from across room with deep breath.

## 2014-07-26 ENCOUNTER — Observation Stay (HOSPITAL_COMMUNITY): Payer: BC Managed Care – PPO

## 2014-07-26 DIAGNOSIS — D62 Acute posthemorrhagic anemia: Secondary | ICD-10-CM | POA: Diagnosis present

## 2014-07-26 DIAGNOSIS — R4182 Altered mental status, unspecified: Secondary | ICD-10-CM | POA: Diagnosis present

## 2014-07-26 DIAGNOSIS — N179 Acute kidney failure, unspecified: Secondary | ICD-10-CM | POA: Diagnosis present

## 2014-07-26 DIAGNOSIS — Z87891 Personal history of nicotine dependence: Secondary | ICD-10-CM | POA: Diagnosis not present

## 2014-07-26 DIAGNOSIS — D72829 Elevated white blood cell count, unspecified: Secondary | ICD-10-CM | POA: Diagnosis present

## 2014-07-26 DIAGNOSIS — Z880 Allergy status to penicillin: Secondary | ICD-10-CM | POA: Diagnosis not present

## 2014-07-26 DIAGNOSIS — A6920 Lyme disease, unspecified: Secondary | ICD-10-CM | POA: Diagnosis present

## 2014-07-26 DIAGNOSIS — R509 Fever, unspecified: Secondary | ICD-10-CM | POA: Diagnosis present

## 2014-07-26 LAB — COMPREHENSIVE METABOLIC PANEL
ALK PHOS: 58 U/L (ref 39–117)
ALT: 13 U/L (ref 0–35)
AST: 17 U/L (ref 0–37)
Albumin: 2.8 g/dL — ABNORMAL LOW (ref 3.5–5.2)
Anion gap: 12 (ref 5–15)
BILIRUBIN TOTAL: 0.3 mg/dL (ref 0.3–1.2)
BUN: 13 mg/dL (ref 6–23)
CHLORIDE: 102 meq/L (ref 96–112)
CO2: 22 meq/L (ref 19–32)
CREATININE: 1.04 mg/dL (ref 0.50–1.10)
Calcium: 7.7 mg/dL — ABNORMAL LOW (ref 8.4–10.5)
GFR calc Af Amer: 72 mL/min — ABNORMAL LOW (ref >90)
GFR, EST NON AFRICAN AMERICAN: 62 mL/min — AB (ref >90)
Glucose, Bld: 102 mg/dL — ABNORMAL HIGH (ref 70–99)
POTASSIUM: 3.6 meq/L — AB (ref 3.7–5.3)
Sodium: 136 mEq/L — ABNORMAL LOW (ref 137–147)
Total Protein: 5.7 g/dL — ABNORMAL LOW (ref 6.0–8.3)

## 2014-07-26 LAB — CBC
HCT: 30 % — ABNORMAL LOW (ref 36.0–46.0)
Hemoglobin: 9.7 g/dL — ABNORMAL LOW (ref 12.0–15.0)
MCH: 30.1 pg (ref 26.0–34.0)
MCHC: 32.3 g/dL (ref 30.0–36.0)
MCV: 93.2 fL (ref 78.0–100.0)
PLATELETS: 295 10*3/uL (ref 150–400)
RBC: 3.22 MIL/uL — ABNORMAL LOW (ref 3.87–5.11)
RDW: 13 % (ref 11.5–15.5)
WBC: 11 10*3/uL — AB (ref 4.0–10.5)

## 2014-07-26 LAB — TSH: TSH: 2.57 u[IU]/mL (ref 0.350–4.500)

## 2014-07-26 LAB — INFLUENZA PANEL BY PCR (TYPE A & B)
H1N1 flu by pcr: NOT DETECTED
Influenza A By PCR: NEGATIVE
Influenza B By PCR: NEGATIVE

## 2014-07-26 LAB — HEMOGLOBIN A1C
Hgb A1c MFr Bld: 5.6 % (ref <5.7)
MEAN PLASMA GLUCOSE: 114 mg/dL (ref <117)

## 2014-07-26 LAB — GLUCOSE, CAPILLARY: GLUCOSE-CAPILLARY: 97 mg/dL (ref 70–99)

## 2014-07-26 MED ORDER — ALBUTEROL SULFATE (2.5 MG/3ML) 0.083% IN NEBU: 5.0000 mg | INHALATION_SOLUTION | RESPIRATORY_TRACT | Status: DC | PRN

## 2014-07-26 MED ORDER — ATOVAQUONE 750 MG/5ML PO SUSP
1500.0000 mg | ORAL | Status: DC
  Filled 2014-07-26: qty 10

## 2014-07-26 MED ORDER — SODIUM CHLORIDE 0.9 % IJ SOLN
10.0000 mL | INTRAMUSCULAR | Status: DC | PRN
  Administered 2014-07-28: 10 mL

## 2014-07-26 MED ORDER — GABAPENTIN 300 MG PO CAPS
300.0000 mg | ORAL_CAPSULE | Freq: Two times a day (BID) | ORAL | Status: DC
  Administered 2014-07-26 – 2014-07-28 (×4): 900 mg via ORAL
  Filled 2014-07-26 (×5): qty 3

## 2014-07-26 MED ORDER — PROMETHAZINE HCL 25 MG PO TABS
25.0000 mg | ORAL_TABLET | Freq: Four times a day (QID) | ORAL | Status: DC | PRN
  Administered 2014-07-28: 25 mg via ORAL
  Filled 2014-07-26: qty 1

## 2014-07-26 MED ORDER — ATOVAQUONE 750 MG/5ML PO SUSP
1500.0000 mg | ORAL | Status: DC
  Administered 2014-07-26 – 2014-07-28 (×2): 1500 mg via ORAL
  Filled 2014-07-26 (×5): qty 10

## 2014-07-26 MED ORDER — LORAZEPAM 1 MG PO TABS: 2.0000 mg | ORAL_TABLET | Freq: Two times a day (BID) | ORAL | Status: DC | PRN

## 2014-07-26 MED ORDER — SODIUM CHLORIDE 0.9 % IJ SOLN: 10.0000 mL | Freq: Two times a day (BID) | INTRAMUSCULAR | Status: DC

## 2014-07-26 MED ORDER — DULOXETINE HCL 30 MG PO CPEP
30.0000 mg | ORAL_CAPSULE | Freq: Every day | ORAL | Status: DC
  Administered 2014-07-26 – 2014-07-28 (×3): 30 mg via ORAL
  Filled 2014-07-26 (×3): qty 1

## 2014-07-26 MED ORDER — LAMOTRIGINE 100 MG PO TABS
100.0000 mg | ORAL_TABLET | Freq: Once | ORAL | Status: AC
  Administered 2014-07-26: 100 mg via ORAL
  Filled 2014-07-26: qty 1

## 2014-07-26 MED ORDER — POTASSIUM CHLORIDE CRYS ER 20 MEQ PO TBCR
40.0000 meq | EXTENDED_RELEASE_TABLET | Freq: Once | ORAL | Status: AC
  Administered 2014-07-26: 40 meq via ORAL
  Filled 2014-07-26: qty 2

## 2014-07-26 MED ORDER — GABAPENTIN 300 MG PO CAPS
300.0000 mg | ORAL_CAPSULE | Freq: Once | ORAL | Status: AC
  Administered 2014-07-26: 900 mg via ORAL
  Filled 2014-07-26: qty 3

## 2014-07-26 MED ORDER — LAMOTRIGINE 100 MG PO TABS
100.0000 mg | ORAL_TABLET | Freq: Two times a day (BID) | ORAL | Status: DC
  Administered 2014-07-27 – 2014-07-28 (×4): 100 mg via ORAL
  Filled 2014-07-26 (×6): qty 1

## 2014-07-26 MED ORDER — TOPIRAMATE 100 MG PO TABS
100.0000 mg | ORAL_TABLET | Freq: Every day | ORAL | Status: DC
  Administered 2014-07-26 – 2014-07-27 (×2): 100 mg via ORAL
  Filled 2014-07-26 (×3): qty 1

## 2014-07-26 MED ORDER — SULFAMETHOXAZOLE-TMP DS 800-160 MG PO TABS
1.0000 | ORAL_TABLET | ORAL | Status: DC
  Administered 2014-07-26 – 2014-07-28 (×2): 1 via ORAL
  Filled 2014-07-26 (×2): qty 1

## 2014-07-26 NOTE — Progress Notes (Signed)
Patient ID: MAHEEN CWIKLA, female   DOB: 27-May-1966, 48 y.o.   MRN: [id number]  [facility] PROGRESS NOTE  BETZAYDA BRAXTON NWG:[id number] DOB: 08-06-1966 DOA: 07/25/2014 PCP: Elie Goody, NP  Brief narrative: Patient is 48 year old female with history of depression, presenting to [facility] emergency department with main concern of progressively worsening fevers, MAXIMUM TEMPERATURE 105 Fahrenheit. Patient explained this started several days prior to admission and has not gotten better despite taking Tylenol several times per day. Patient also reports myalgias, cough productive of clear sputum. This has been associated with malaise and poor oral intake  Assessment and Plan:    Principal Problem:   Fever - Unclear etiology and possibly related to pneumonia, viral versus bacterial - No clear infection noted on chest x-ray - Tmax since admission 99.5 Fahrenheit, patient reports feeling better this morning - Patient placed on Zithromax and Rocephin, continued day 2 - Followup on blood cultures Active Problems:   Leukocytosis - Likely secondary to principal problem, antibiotics as noted above - WBC trending down, will repeat CBC in the morning   Hypokalemia - Supplemented repeat BMP in the morning   History of Lyme disease   Acute renal failure - Secondary to prerenal etiology and poor oral intake - IV fluids provided and creatinine is within normal limits     Acute blood loss anemia  - Patient says she has history of anemia but is unclear what his baseline hemoglobin  - Drop in hemoglobin since admission possibly dilutional  - repeat CBC in AM  DVT prophylaxis  Heparin SQ while pt is in hospital  Code Status: Full Family Communication: Pt at bedside Disposition Plan: Home when medically stable  IV Access:   Peripheral IV Procedures and diagnostic studies:   Dg Chest 2 View (07/25/2014   No radiographic evidence of acute cardiopulmonary disease.  Left-sided PICC.   Dg Chest  Port 1 View  07/26/2014  No acute cardiopulmonary process.   Medical Consultants:   None  Other Consultants:   None  Anti-Infectives:   Zithromax 10/01 --> Rocephin 10/01 -->   Debbora Presto, MD  [facility] Pager [phone]  If 7PM-7AM, please contact night-coverage [email or web address] Password [email or web address] 07/26/2014, 10:42 AM   LOS: 1 day   HPI/Subjective: No events overnight.   Objective: Filed Vitals:   07/25/14 2214 07/25/14 2215 07/26/14 0414 07/26/14 0917  BP: 108/63  101/48   Pulse: 110  102   Temp:   99.5 F (37.5 C) 99.1 F (37.3 C)  TempSrc:   Oral Oral  Resp: 18  18   Height:      Weight:      SpO2: 84% 99% 99% 98%    Intake/Output Summary (Last 24 hours) at 07/26/14 1042 Last data filed at 07/26/14 0959  Gross per 24 hour  Intake 1071.25 ml  Output   1051 ml  Net  20.25 ml    Exam:   General:  Pt is alert, follows commands appropriately, not in acute distress  Cardiovascular: Regular rhythm, tachycardic, no murmurs, no rubs, no gallops  Respiratory: Clear to auscultation bilaterally, mild rhonchi at bases   Abdomen: Soft, non tender, non distended, bowel sounds present, no guarding  Extremities: No edema, pulses DP and PT palpable bilaterally  Neuro: Grossly nonfocal  Data Reviewed: Basic Metabolic Panel:  Recent Labs Lab 07/25/14 1909 07/26/14 0400  NA 133* 136*  K 3.7 3.6*  CL 99 102  CO2 22 22  GLUCOSE 106* 102*  BUN 14 13  CREATININE 1.11* 1.04  CALCIUM 8.2* 7.7*   Liver Function Tests:  Recent Labs Lab 07/25/14 1909 07/26/14 0400  AST 20 17  ALT 15 13  ALKPHOS 69 58  BILITOT 0.3 0.3  PROT 6.7 5.7*  ALBUMIN 3.3* 2.8*   CBC:  Recent Labs Lab 07/25/14 1907 07/26/14 0400  WBC 11.9* 11.0*  HGB 11.3* 9.7*  HCT 34.1* 30.0*  MCV 91.7 93.2  PLT 350 295   CBG:  Recent Labs Lab 07/26/14 0747  GLUCAP 97   Scheduled Meds: . aspirin EC  81 mg Oral Daily  . azithromycin  250 mg Intravenous Q24H  . cefTRIAXone  IV  1 g  Intravenous Q24H  . folic acid  1 mg Oral Daily  . heparin  5,000 Units Subcutaneous 3 times per day  . thiamine  100 mg Oral Daily   Continuous Infusions: . sodium chloride 75 mL/hr at 07/26/14 [phone]

## 2014-07-26 NOTE — Progress Notes (Signed)
UR completed 

## 2014-07-27 LAB — BASIC METABOLIC PANEL
Anion gap: 11 (ref 5–15)
BUN: 7 mg/dL (ref 6–23)
CO2: 20 mEq/L (ref 19–32)
Calcium: 8.2 mg/dL — ABNORMAL LOW (ref 8.4–10.5)
Chloride: 110 mEq/L (ref 96–112)
Creatinine, Ser: 0.74 mg/dL (ref 0.50–1.10)
GFR calc Af Amer: >90 mL/min (ref >90)
GFR calc non Af Amer: >90 mL/min (ref >90)
GLUCOSE: 112 mg/dL — AB (ref 70–99)
POTASSIUM: 4 meq/L (ref 3.7–5.3)
SODIUM: 141 meq/L (ref 137–147)

## 2014-07-27 LAB — CBC
HCT: 32.6 % — ABNORMAL LOW (ref 36.0–46.0)
HEMOGLOBIN: 10.5 g/dL — AB (ref 12.0–15.0)
MCH: 29.8 pg (ref 26.0–34.0)
MCHC: 32.2 g/dL (ref 30.0–36.0)
MCV: 92.6 fL (ref 78.0–100.0)
Platelets: 311 10*3/uL (ref 150–400)
RBC: 3.52 MIL/uL — ABNORMAL LOW (ref 3.87–5.11)
RDW: 13 % (ref 11.5–15.5)
WBC: 7.2 10*3/uL (ref 4.0–10.5)

## 2014-07-27 LAB — GLUCOSE, CAPILLARY: GLUCOSE-CAPILLARY: 114 mg/dL — AB (ref 70–99)

## 2014-07-27 NOTE — Progress Notes (Signed)
Patient ID: Miranda [name], female   DOB: 07/28/1966, 48 y.o.   MRN: [id number]  [facility] PROGRESS NOTE  Miranda [name] [id number] DOB: 09/12/1966 DOA: 07/25/2014 PCP: Elie GoodyWORRELL,TAMMY, NP  Brief narrative:  Patient is 48 year old female with history of depression, [facility] with main concern of progressively worsening fevers, MAXIMUM TEMPERATURE 105 Fahrenheit. Patient explained this started several days prior to admission and has not gotten better despite taking Tylenol several times per day. Patient also reports myalgias, cough productive of clear sputum. This has been associated with malaise and poor oral intake.  Assessment and Plan:   Principal Problem:  Fever  - Unclear etiology and possibly related to pneumonia, viral versus bacterial, imposed on recent diagnosis of lyme disease  - No clear infection noted on chest x-ray  - Tmax since admission 99.5 Fahrenheit, patient reports feeling better this morning  - Patient placed on Zithromax and Rocephin, continued day 3 - Followup on blood cultures which are negative to date  Active Problems:  Leukocytosis  - Likely secondary to principal problem, antibiotics as noted above  - WBC trending down and WNL this AM Hypokalemia  - Supplemented and WNLT his AM History of Lyme disease  - ABX as noted above  Acute renal failure  - Secondary to prerenal etiology and poor oral intake  - IV fluids provided and creatinine is within normal limits  Acute blood loss anemia  - Patient says she has history of anemia but is unclear what his baseline hemoglobin  - Drop in hemoglobin since admission possibly dilutional  - no signs of active bleeding, hg remains stable in the past 24 hours   DVT prophylaxis  Heparin SQ while pt is in hospital  Code Status: Full  Family Communication: Pt at bedside  Disposition Plan: Home when medically stable   IV Access:   Peripheral IV Procedures and diagnostic studies:    Dg Chest 2 View (07/25/2014 No radiographic evidence of acute cardiopulmonary disease. Left-sided PICC.  Dg Chest Port 1 View 07/26/2014 No acute cardiopulmonary process.  Medical Consultants:   None  Other Consultants:   None  Anti-Infectives:   Zithromax 10/01 -->  Rocephin 10/01 -->   Debbora PrestoMAGICK-Jadyn Barge, MD  [facility] Pager [phone]  If 7PM-7AM, please contact night-coverage [email or web address] Password [email or web address] 07/27/2014, 1:22 PM   LOS: 2 days   HPI/Subjective: No events overnight.   Objective: Filed Vitals:   07/26/14 1348 07/26/14 1435 07/26/14 2112 07/27/14 0518  BP:  123/69 121/69 135/76  Pulse:  100 86 93  Temp: 98 F (36.7 C) 98.7 F (37.1 C) 98.1 F (36.7 C) 98 F (36.7 C)  TempSrc: Oral Oral Oral Oral  Resp:  18 18 18   Height:      Weight:      SpO2:  100% 98% 100%    Intake/Output Summary (Last 24 hours) at 07/27/14 1322 Last data filed at 07/27/14 1052  Gross per 24 hour  Intake 2507.5 ml  Output   3300 ml  Net -792.5 ml    Exam:   General:  Pt is alert, follows commands appropriately, not in acute distress  Cardiovascular: Regular rate and rhythm, S1/S2, no murmurs, no rubs, no gallops  Respiratory: Clear to auscultation bilaterally, no wheezing, no crackles, no rhonchi  Abdomen: Soft, non tender, non distended, bowel sounds present, no guarding  Extremities: No edema, pulses DP and PT palpable bilaterally  Neuro: Grossly nonfocal  Data Reviewed: Basic Metabolic Panel:  Recent Labs  Lab 07/25/14 1909 07/26/14 0400 07/27/14 0547  NA 133* 136* 141  K 3.7 3.6* 4.0  CL 99 102 110  CO2 22 22 20   GLUCOSE 106* 102* 112*  BUN 14 13 7   CREATININE 1.11* 1.04 0.74  CALCIUM 8.2* 7.7* 8.2*   Liver Function Tests:  Recent Labs Lab 07/25/14 1909 07/26/14 0400  AST 20 17  ALT 15 13  ALKPHOS 69 58  BILITOT 0.3 0.3  PROT 6.7 5.7*  ALBUMIN 3.3* 2.8*   CBC:  Recent Labs Lab 07/25/14 1907 07/26/14 0400 07/27/14 0547  WBC 11.9* 11.0* 7.2   HGB 11.3* 9.7* 10.5*  HCT 34.1* 30.0* 32.6*  MCV 91.7 93.2 92.6  PLT 350 295 311   CBG:  Recent Labs Lab 07/26/14 0747 07/27/14 0735  GLUCAP 97 114*   Scheduled Meds: . aspirin EC  81 mg Oral Daily  . atovaquone  1,500 mg Oral 2 times per day on Sun Mon Wed Fri  . azithromycin  250 mg Intravenous Q24H  . cefTRIAXone  IV  1 g Intravenous Q24H  . DULoxetine  30 mg Oral Daily  . folic acid  1 mg Oral Daily  . gabapentin  300-900 mg Oral BID  . heparin  5,000 Units Subcutaneous 3 times per day  . lamoTRIgine  100 mg Oral BID  . sulfamethoxazole-trime  1 tablet Oral Once per day on Sun Mon Wed Fri  . thiamine  100 mg Oral Daily  . topiramate  100 mg Oral QHS   Continuous Infusions: . sodium chloride 75 mL/hr at 07/27/14 0056

## 2014-07-28 LAB — CBC
HCT: 34.8 % — ABNORMAL LOW (ref 36.0–46.0)
Hemoglobin: 11.4 g/dL — ABNORMAL LOW (ref 12.0–15.0)
MCH: 30.1 pg (ref 26.0–34.0)
MCHC: 32.8 g/dL (ref 30.0–36.0)
MCV: 91.8 fL (ref 78.0–100.0)
Platelets: 379 10*3/uL (ref 150–400)
RBC: 3.79 MIL/uL — ABNORMAL LOW (ref 3.87–5.11)
RDW: 12.6 % (ref 11.5–15.5)
WBC: 7.9 10*3/uL (ref 4.0–10.5)

## 2014-07-28 LAB — BASIC METABOLIC PANEL
ANION GAP: 12 (ref 5–15)
BUN: 7 mg/dL (ref 6–23)
CHLORIDE: 108 meq/L (ref 96–112)
CO2: 20 meq/L (ref 19–32)
CREATININE: 0.71 mg/dL (ref 0.50–1.10)
Calcium: 8.8 mg/dL (ref 8.4–10.5)
GFR calc non Af Amer: >90 mL/min (ref >90)
Glucose, Bld: 118 mg/dL — ABNORMAL HIGH (ref 70–99)
POTASSIUM: 3.6 meq/L — AB (ref 3.7–5.3)
Sodium: 140 mEq/L (ref 137–147)

## 2014-07-28 LAB — GLUCOSE, CAPILLARY: Glucose-Capillary: 130 mg/dL — ABNORMAL HIGH (ref 70–99)

## 2014-07-28 MED ORDER — LORAZEPAM 2 MG PO TABS: 2.0000 mg | ORAL_TABLET | Freq: Two times a day (BID) | ORAL | Status: DC | PRN

## 2014-07-28 MED ORDER — OXYCODONE HCL 5 MG PO TABS: 5.0000 mg | ORAL_TABLET | ORAL | Status: DC | PRN

## 2014-07-28 MED ORDER — OXYCODONE HCL 5 MG PO TABS
5.0000 mg | ORAL_TABLET | Freq: Once | ORAL | Status: AC
Start: 2014-07-28 — End: 2014-07-28
  Administered 2014-07-28: 5 mg via ORAL
  Filled 2014-07-28: qty 1

## 2014-07-28 MED ORDER — POTASSIUM CHLORIDE CRYS ER 20 MEQ PO TBCR
40.0000 meq | EXTENDED_RELEASE_TABLET | Freq: Once | ORAL | Status: AC
  Administered 2014-07-28: 40 meq via ORAL
  Filled 2014-07-28: qty 2

## 2014-07-28 MED ORDER — OXYCODONE HCL 5 MG PO TABS
5.0000 mg | ORAL_TABLET | Freq: Once | ORAL | Status: AC
  Administered 2014-07-28: 5 mg via ORAL
  Filled 2014-07-28: qty 1

## 2014-07-28 MED ORDER — PROMETHAZINE HCL 25 MG PO TABS: 25.0000 mg | ORAL_TABLET | Freq: Four times a day (QID) | ORAL | Status: DC | PRN

## 2014-07-28 MED ORDER — HEPARIN SOD (PORK) LOCK FLUSH 100 UNIT/ML IV SOLN
250.0000 [IU] | INTRAVENOUS | Status: AC | PRN
Start: 2014-07-28 — End: 2014-07-28
  Administered 2014-07-28: 250 [IU]

## 2014-07-28 NOTE — Discharge Instructions (Signed)
Upper Respiratory Infection, Adult An upper respiratory infection (URI) is also known as the common cold. It is often caused by a type of germ (virus). Colds are easily spread (contagious). You can pass it to others by kissing, coughing, sneezing, or drinking out of the same glass. Usually, you get better in 1 or 2 weeks.  HOME CARE   Only take medicine as told by your doctor.  Use a warm mist humidifier or breathe in steam from a hot shower.  Drink enough water and fluids to keep your pee (urine) clear or pale yellow.  Get plenty of rest.  Return to work when your temperature is back to normal or as told by your doctor. You may use a face mask and wash your hands to stop your cold from spreading. GET HELP RIGHT AWAY IF:   After the first few days, you feel you are getting worse.  You have questions about your medicine.  You have chills, shortness of breath, or brown or red spit (mucus).  You have yellow or brown snot (nasal discharge) or pain in the face, especially when you bend forward.  You have a fever, puffy (swollen) neck, pain when you swallow, or white spots in the back of your throat.  You have a bad headache, ear pain, sinus pain, or chest pain.  You have a high-pitched whistling sound when you breathe in and out (wheezing).  You have a lasting cough or cough up blood.  You have sore muscles or a stiff neck. MAKE SURE YOU:   Understand these instructions.  Will watch your condition.  Will get help right away if you are not doing well or get worse. Document Released: 03/29/2008 Document Revised: 01/03/2012 Document Reviewed: 01/16/2014 [employer] Patient Information 2015 [employer]. This information is not intended to replace advice given to you by your health care provider. Make sure you discuss any questions you have with your health care provider.  

## 2014-07-28 NOTE — Discharge Summary (Addendum)
Physician Discharge Summary  Miranda [name]:[id number] DOB: 1966/02/27 DOA: 07/25/2014  PCP: Elie Goody, NP  Admit date: 07/25/2014 Discharge date: 07/28/2014  Recommendations for Outpatient Follow-up:  1. Pt will need to follow up with PCP in 2-3 weeks post discharge 2. Please obtain BMP to evaluate electrolytes and kidney function 3. Please also check CBC to evaluate Hg and Hct levels  Discharge Diagnoses:  Principal Problem:   Fever Active Problems:   Depression   History of Lyme disease  Discharge Condition: Stable  Diet recommendation: Heart healthy diet discussed in details   Brief narrative:  Patient is 48 year old female with history of depression, presenting to [facility] long emergency department with main concern of progressively worsening fevers, MAXIMUM TEMPERATURE 105 Fahrenheit. Patient explained this started several days prior to admission and has not gotten better despite taking Tylenol several times per day. Patient also reports myalgias, cough productive of clear sputum. This has been associated with malaise and poor oral intake.   Assessment and Plan:   Principal Problem:  Fever  - Unclear etiology and possibly related to pneumonia, viral versus bacterial, imposed on recent diagnosis of lyme disease  - No clear infection noted on chest x-ray  - afebrile over the past 72 hours  - Patient placed on Zithromax and Rocephin, continued day 4 - pt wants to go home and will discharge on home medical regimen that pt was started on for recent diagnosis of Lyme disease by her specialist  - Followup on blood cultures which are negative to date  Active Problems:  Leukocytosis  - Likely secondary to principal problem, antibiotics as noted above  - WBC trending down and WNL this AM  Hypokalemia  - Supplemented prior to discharge  History of Lyme disease  - ABX as noted above as per home medical regimen  Acute renal failure  - Secondary to prerenal etiology and poor oral  intake  - IV fluids provided and creatinine is within normal limits  Acute blood loss anemia  - Patient says she has history of anemia but is unclear what his baseline hemoglobin  - Drop in hemoglobin since admission possibly dilutional  - no signs of active bleeding, hg remains stable in the past 24 hours   Code Status: Full  Family Communication: Pt at bedside  Disposition Plan: Home   IV Access:   Peripheral IV Procedures and diagnostic studies:   Dg Chest 2 View (07/25/2014 No radiographic evidence of acute cardiopulmonary disease. Left-sided PICC.  Dg Chest Port 1 View 07/26/2014 No acute cardiopulmonary process.  Medical Consultants:   None  Other Consultants:   None  Anti-Infectives:   Zithromax 10/01 --> 10/04 Rocephin 10/01 --> 10/04  Discharge Exam: Filed Vitals:   07/28/14 0454  BP: 136/71  Pulse: 79  Temp: 97.8 F (36.6 C)  Resp: 20   Filed Vitals:   07/27/14 0518 07/27/14 1347 07/27/14 2014 07/28/14 0454  BP: 135/76 150/77 131/71 136/71  Pulse: 93 100 86 79  Temp: 98 F (36.7 C) 97.7 F (36.5 C) 98.4 F (36.9 C) 97.8 F (36.6 C)  TempSrc: Oral Oral Oral Oral  Resp: 18 18 18 20   Height:      Weight:      SpO2: 100% 100% 97% 99%    General: Pt is alert, follows commands appropriately, not in acute distress Cardiovascular: Regular rate and rhythm, S1/S2 +, no murmurs, no rubs, no gallops Respiratory: Clear to auscultation bilaterally, no wheezing, no crackles, no rhonchi Abdominal: Soft, non  tender, non distended, bowel sounds +, no guarding Extremities: no edema, no cyanosis, pulses palpable bilaterally DP and PT Neuro: Grossly nonfocal  Discharge Instructions  Discharge Instructions   Diet - low sodium heart healthy    Complete by:  As directed      Increase activity slowly    Complete by:  As directed             Medication List         atovaquone 750 MG/5ML suspension  Commonly known as:  MEPRON  - Take 1,500 mg by mouth daily. Twice  a day on Monday, Wednesday, Friday, and Sunday  - Last dose on 10/10     azithromycin 500 mg in dextrose 5 % 250 mL  - Inject 500 mg into the vein every evening. Monday, Wednesday, Friday, Sunday   - Last 10/10     CIPRO IN D5W 400 MG/200ML Soln  Generic drug:  ciprofloxacin  - Inject 400 mg into the vein See admin instructions. Monday, Wednesday, Friday, and Sunday   - Last dose on 10/10     DULoxetine 30 MG capsule  Commonly known as:  CYMBALTA  Take 30 mg by mouth daily.     Fish Oil 1000 MG Caps  Take 1,000 mg by mouth 2 (two) times daily.     gabapentin 300 MG capsule  Commonly known as:  NEURONTIN  Take 300-900 mg by mouth 2 (two) times daily. May titratate up as needed     LACTATED RINGERS IV  Inject 600 mg into the vein daily.     lamoTRIgine 100 MG tablet  Commonly known as:  LAMICTAL  Take 100 mg by mouth 2 (two) times daily.     LORazepam 2 MG tablet  Commonly known as:  ATIVAN  Take 1 tablet (2 mg total) by mouth 2 (two) times daily as needed for anxiety.     OVER THE COUNTER MEDICATION  - Take 1,000 mg by mouth See admin instructions. Artemisin 1000mg    - Twice a day on Monday, Wednesday, Friday, and Sunday  - Last dose on 10/10     OVER THE COUNTER MEDICATION  - Take 10 drops by mouth See admin instructions. Enula 10 drops  - Twice a day on Monday, Wednesday, Friday, and Sunday  - Last dose on 10/10     OVER THE COUNTER MEDICATION  Take 100 mg by mouth 2 (two) times daily. Nettokinase 100mg      OVER THE COUNTER MEDICATION  Take 500 mg by mouth See admin instructions. Last dose on 10/05     OXYCONTIN 20 mg T12a 12 hr tablet  Generic drug:  OxyCODONE  Take 20 mg by mouth every 12 (twelve) hours.     oxyCODONE 5 MG immediate release tablet  Commonly known as:  Oxy IR/ROXICODONE  Take 1 tablet (5 mg total) by mouth every 4 (four) hours as needed for moderate pain, severe pain or breakthrough pain. Fill on or after 01/26/14     promethazine 25  MG tablet  Commonly known as:  PHENERGAN  Take 1 tablet (25 mg total) by mouth every 6 (six) hours as needed for nausea or vomiting.     sulfamethoxazole-trimethoprim 800-160 MG per tablet  Commonly known as:  BACTRIM DS  - Take 1 tablet by mouth See admin instructions. Monday, Wednesday, Friday, Sunday   - Last dose 10/10     topiramate 100 MG tablet  Commonly known as:  TOPAMAX  Take 100  mg by mouth every evening. Last dose on 10/10     TURMERIC CURCUMIN PO  Take 200 mg by mouth 2 (two) times daily.     vitamin C 1000 MG tablet  Take 1,000 mg by mouth daily. Last dose on 10/10     Xylitol Powd  - Take by mouth every evening. 4 tsp every evening   - Last dose on 10/05           Follow-up Information   Schedule an appointment as soon as possible for a visit with [facility], NP.   Specialty:  Nurse Practitioner   Contact information:   [phone] [place] [address] [phone]       Schedule an appointment as soon as possible for a visit with Lora Paula, MD.   Specialty:  Infectious Diseases   Contact information:   [address] [place] [address] [phone]       Call Debbora Presto, MD. (As needed call my cell phone [phone])    Specialty:  Internal Medicine   Contact information:   [address] [place] [address] [phone]        The results of significant diagnostics from this hospitalization (including imaging, microbiology, ancillary and laboratory) are listed below for reference.     Microbiology: Recent Results (from the past 240 hour(s))  CULTURE, BLOOD (ROUTINE X 2)     Status: None   Collection Time    07/25/14  7:09 PM      Result Value Ref Range Status   Specimen Description BLOOD LEFT PICC   Final   Special Requests BOTTLES DRAWN AEROBIC AND ANAEROBIC   Final   Culture  Setup Time     Final   Value: 07/26/2014 00:55     Performed at [facility]    Culture     Final   Value:        BLOOD CULTURE RECEIVED NO GROWTH TO DATE CULTURE WILL BE HELD FOR 5 DAYS BEFORE ISSUING A FINAL NEGATIVE REPORT     Performed at [facility]   Report Status PENDING   Incomplete  CULTURE, BLOOD (ROUTINE X 2)     Status: None   Collection Time    07/25/14  7:16 PM      Result Value Ref Range Status   Specimen Description BLOOD LEFT HAND   Final   Special Requests BOTTLES DRAWN AEROBIC AND ANAEROBIC   Final   Culture  Setup Time     Final   Value: 07/26/2014 00:51     Performed at [facility]   Culture     Final   Value:        BLOOD CULTURE RECEIVED NO GROWTH TO DATE CULTURE WILL BE HELD FOR 5 DAYS BEFORE ISSUING A FINAL NEGATIVE REPORT     Performed at [facility]   Report Status PENDING   Incomplete     Labs: Basic Metabolic Panel:  Recent Labs Lab 07/25/14 1909 07/26/14 0400 07/27/14 0547 07/28/14 0450  NA 133* 136* 141 140  K 3.7 3.6* 4.0 3.6*  CL 99 102 110 108  CO2 22 22 20 20   GLUCOSE 106* 102* 112* 118*  BUN 14 13 7 7   CREATININE 1.11* 1.04 0.74 0.71  CALCIUM 8.2* 7.7* 8.2* 8.8   Liver Function Tests:  Recent Labs Lab 07/25/14 1909 07/26/14 0400  AST 20 17  ALT 15 13  ALKPHOS 69 58  BILITOT 0.3 0.3  PROT 6.7 5.7*  ALBUMIN 3.3* 2.8*   No results found for this basename: LIPASE, AMYLASE,  in the last 168 hours No results found for this basename: AMMONIA,  in the last 168 hours CBC:  Recent Labs Lab 07/25/14 1907 07/26/14 0400 07/27/14 0547 07/28/14 0450  WBC 11.9* 11.0* 7.2 7.9  HGB 11.3* 9.7* 10.5* 11.4*  HCT 34.1* 30.0* 32.6* 34.8*  MCV 91.7 93.2 92.6 91.8  PLT 350 295 311 379   Cardiac Enzymes: No results found for this basename: CKTOTAL, CKMB, CKMBINDEX, TROPONINI,  in the last 168 hours BNP: BNP (last 3 results) No results found for this basename: PROBNP,  in the last 8760 hours CBG:  Recent Labs Lab 07/26/14 0747 07/27/14 0735 07/28/14 0801  GLUCAP 97 114* 130*      SIGNED: Time coordinating discharge: Over 30 minutes  Debbora PrestoMAGICK-Mili Piltz, MD  [facility] 07/28/2014, 9:24 AM Pager [phone]  If 7PM-7AM, please contact night-coverage [email or web address] Password [email or web address]

## 2014-08-01 LAB — CULTURE, BLOOD (ROUTINE X 2)
CULTURE: NO GROWTH
Culture: NO GROWTH

## 2014-08-26 ENCOUNTER — Emergency Department (HOSPITAL_COMMUNITY): Payer: BC Managed Care – PPO

## 2014-08-26 ENCOUNTER — Encounter (HOSPITAL_COMMUNITY): Payer: Self-pay | Admitting: Nurse Practitioner

## 2014-08-26 ENCOUNTER — Inpatient Hospital Stay (HOSPITAL_COMMUNITY)
Admission: EM | Admit: 2014-08-26 | Discharge: 2014-08-30 | DRG: 189 | Disposition: A | Discharge status: Home / Self Care | Payer: BC Managed Care – PPO | Attending: Internal Medicine | Admitting: Internal Medicine

## 2014-08-26 DIAGNOSIS — R197 Diarrhea, unspecified: Secondary | ICD-10-CM | POA: Diagnosis present

## 2014-08-26 DIAGNOSIS — R0602 Shortness of breath: Secondary | ICD-10-CM

## 2014-08-26 DIAGNOSIS — K219 Gastro-esophageal reflux disease without esophagitis: Secondary | ICD-10-CM | POA: Diagnosis present

## 2014-08-26 DIAGNOSIS — J9601 Acute respiratory failure with hypoxia: Secondary | ICD-10-CM | POA: Diagnosis present

## 2014-08-26 DIAGNOSIS — G894 Chronic pain syndrome: Secondary | ICD-10-CM

## 2014-08-26 DIAGNOSIS — D509 Iron deficiency anemia, unspecified: Secondary | ICD-10-CM | POA: Diagnosis present

## 2014-08-26 DIAGNOSIS — A6920 Lyme disease, unspecified: Secondary | ICD-10-CM | POA: Diagnosis present

## 2014-08-26 DIAGNOSIS — M129 Arthropathy, unspecified: Secondary | ICD-10-CM | POA: Diagnosis present

## 2014-08-26 DIAGNOSIS — F1020 Alcohol dependence, uncomplicated: Secondary | ICD-10-CM | POA: Diagnosis present

## 2014-08-26 DIAGNOSIS — J810 Acute pulmonary edema: Secondary | ICD-10-CM

## 2014-08-26 DIAGNOSIS — IMO0001 Reserved for inherently not codable concepts without codable children: Secondary | ICD-10-CM | POA: Insufficient documentation

## 2014-08-26 DIAGNOSIS — R030 Elevated blood-pressure reading, without diagnosis of hypertension: Secondary | ICD-10-CM | POA: Diagnosis present

## 2014-08-26 DIAGNOSIS — R0600 Dyspnea, unspecified: Secondary | ICD-10-CM

## 2014-08-26 DIAGNOSIS — I517 Cardiomegaly: Secondary | ICD-10-CM | POA: Diagnosis present

## 2014-08-26 DIAGNOSIS — R509 Fever, unspecified: Secondary | ICD-10-CM

## 2014-08-26 DIAGNOSIS — Z792 Long term (current) use of antibiotics: Secondary | ICD-10-CM | POA: Diagnosis not present

## 2014-08-26 DIAGNOSIS — Z87891 Personal history of nicotine dependence: Secondary | ICD-10-CM | POA: Diagnosis not present

## 2014-08-26 DIAGNOSIS — J811 Chronic pulmonary edema: Secondary | ICD-10-CM | POA: Diagnosis present

## 2014-08-26 DIAGNOSIS — F329 Major depressive disorder, single episode, unspecified: Secondary | ICD-10-CM | POA: Diagnosis present

## 2014-08-26 DIAGNOSIS — J189 Pneumonia, unspecified organism: Secondary | ICD-10-CM

## 2014-08-26 DIAGNOSIS — F909 Attention-deficit hyperactivity disorder, unspecified type: Secondary | ICD-10-CM | POA: Diagnosis present

## 2014-08-26 DIAGNOSIS — F419 Anxiety disorder, unspecified: Secondary | ICD-10-CM | POA: Diagnosis present

## 2014-08-26 DIAGNOSIS — M797 Fibromyalgia: Secondary | ICD-10-CM | POA: Diagnosis present

## 2014-08-26 DIAGNOSIS — E611 Iron deficiency: Secondary | ICD-10-CM | POA: Diagnosis present

## 2014-08-26 LAB — CBC WITH DIFFERENTIAL/PLATELET
Basophils Absolute: 0 10*3/uL (ref 0.0–0.1)
Basophils Relative: 0 % (ref 0–1)
Eosinophils Absolute: 0.1 10*3/uL (ref 0.0–0.7)
Eosinophils Relative: 1 % (ref 0–5)
HCT: 27.5 % — ABNORMAL LOW (ref 36.0–46.0)
Hemoglobin: 8.9 g/dL — ABNORMAL LOW (ref 12.0–15.0)
Lymphocytes Relative: 12 % (ref 12–46)
Lymphs Abs: 1.4 10*3/uL (ref 0.7–4.0)
MCH: 30.1 pg (ref 26.0–34.0)
MCHC: 32.4 g/dL (ref 30.0–36.0)
MCV: 92.9 fL (ref 78.0–100.0)
Monocytes Absolute: 0.9 10*3/uL (ref 0.1–1.0)
Monocytes Relative: 8 % (ref 3–12)
Neutro Abs: 9.3 10*3/uL — ABNORMAL HIGH (ref 1.7–7.7)
Neutrophils Relative %: 79 % — ABNORMAL HIGH (ref 43–77)
Platelets: 319 10*3/uL (ref 150–400)
RBC: 2.96 MIL/uL — ABNORMAL LOW (ref 3.87–5.11)
RDW: 13.3 % (ref 11.5–15.5)
WBC: 11.6 10*3/uL — ABNORMAL HIGH (ref 4.0–10.5)

## 2014-08-26 LAB — RETICULOCYTES
RBC.: 2.87 MIL/uL — ABNORMAL LOW (ref 3.87–5.11)
RETIC CT PCT: 1.6 % (ref 0.4–3.1)
Retic Count, Absolute: 45.9 10*3/uL (ref 19.0–186.0)

## 2014-08-26 LAB — CBC
HEMATOCRIT: 26.7 % — AB (ref 36.0–46.0)
HEMOGLOBIN: 8.7 g/dL — AB (ref 12.0–15.0)
MCH: 30.3 pg (ref 26.0–34.0)
MCHC: 32.6 g/dL (ref 30.0–36.0)
MCV: 93 fL (ref 78.0–100.0)
Platelets: 319 10*3/uL (ref 150–400)
RBC: 2.87 MIL/uL — AB (ref 3.87–5.11)
RDW: 13.3 % (ref 11.5–15.5)
WBC: 11.6 10*3/uL — AB (ref 4.0–10.5)

## 2014-08-26 LAB — URINALYSIS, ROUTINE W REFLEX MICROSCOPIC
Bilirubin Urine: NEGATIVE
Glucose, UA: NEGATIVE mg/dL
Ketones, ur: NEGATIVE mg/dL
Leukocytes, UA: NEGATIVE
Nitrite: NEGATIVE
Protein, ur: NEGATIVE mg/dL
Specific Gravity, Urine: 1.015 (ref 1.005–1.030)
Urobilinogen, UA: 0.2 mg/dL (ref 0.0–1.0)
pH: 7 (ref 5.0–8.0)

## 2014-08-26 LAB — BASIC METABOLIC PANEL
Anion gap: 13 (ref 5–15)
BUN: 11 mg/dL (ref 6–23)
CO2: 25 mEq/L (ref 19–32)
Calcium: 8.1 mg/dL — ABNORMAL LOW (ref 8.4–10.5)
Chloride: 103 mEq/L (ref 96–112)
Creatinine, Ser: 0.78 mg/dL (ref 0.50–1.10)
GFR calc Af Amer: >90 mL/min (ref >90)
GFR calc non Af Amer: >90 mL/min (ref >90)
Glucose, Bld: 104 mg/dL — ABNORMAL HIGH (ref 70–99)
Potassium: 4.4 mEq/L (ref 3.7–5.3)
Sodium: 141 mEq/L (ref 137–147)

## 2014-08-26 LAB — CREATININE, SERUM: Creatinine, Ser: 0.79 mg/dL (ref 0.50–1.10)

## 2014-08-26 LAB — URINE MICROSCOPIC-ADD ON

## 2014-08-26 MED ORDER — DEXTROSE 5 % IV SOLN
1.0000 g | Freq: Once | INTRAVENOUS | Status: DC
  Filled 2014-08-26: qty 1

## 2014-08-26 MED ORDER — ATOVAQUONE 750 MG/5ML PO SUSP
1500.0000 mg | Freq: Every day | ORAL | Status: DC
  Administered 2014-08-27 – 2014-08-28 (×2): 1500 mg via ORAL
  Filled 2014-08-26 (×4): qty 10

## 2014-08-26 MED ORDER — CLONAZEPAM 1 MG PO TABS
1.0000 mg | ORAL_TABLET | Freq: Every day | ORAL | Status: DC
  Administered 2014-08-27 – 2014-08-29 (×4): 1 mg via ORAL
  Filled 2014-08-26 (×4): qty 1

## 2014-08-26 MED ORDER — HEPARIN SODIUM (PORCINE) 5000 UNIT/ML IJ SOLN
5000.0000 [IU] | Freq: Three times a day (TID) | INTRAMUSCULAR | Status: DC
  Administered 2014-08-27 – 2014-08-30 (×10): 5000 [IU] via SUBCUTANEOUS
  Filled 2014-08-26 (×14): qty 1

## 2014-08-26 MED ORDER — CEFEPIME HCL 2 G IJ SOLR
2.0000 g | Freq: Three times a day (TID) | INTRAMUSCULAR | Status: DC
  Filled 2014-08-26 (×2): qty 2

## 2014-08-26 MED ORDER — PROMETHAZINE HCL 25 MG PO TABS
25.0000 mg | ORAL_TABLET | Freq: Four times a day (QID) | ORAL | Status: DC | PRN
  Administered 2014-08-27 – 2014-08-28 (×6): 25 mg via ORAL
  Filled 2014-08-26 (×6): qty 1

## 2014-08-26 MED ORDER — IOHEXOL 350 MG/ML SOLN
100.0000 mL | Freq: Once | INTRAVENOUS | Status: AC | PRN
  Administered 2014-08-26: 100 mL via INTRAVENOUS

## 2014-08-26 MED ORDER — VANCOMYCIN HCL 10 G IV SOLR: 1250.0000 mg | Freq: Two times a day (BID) | INTRAVENOUS | Status: DC

## 2014-08-26 MED ORDER — OXYCODONE-ACETAMINOPHEN 7.5-325 MG PO TABS: 2.0000 | ORAL_TABLET | ORAL | Status: DC | PRN

## 2014-08-26 MED ORDER — OXYCODONE-ACETAMINOPHEN 5-325 MG PO TABS
2.0000 | ORAL_TABLET | ORAL | Status: DC | PRN
  Administered 2014-08-27 – 2014-08-30 (×15): 2 via ORAL
  Filled 2014-08-26 (×15): qty 2

## 2014-08-26 MED ORDER — VANCOMYCIN HCL 10 G IV SOLR
1500.0000 mg | Freq: Once | INTRAVENOUS | Status: DC
Start: 2014-08-26 — End: 2014-08-27
  Filled 2014-08-26: qty 1500

## 2014-08-26 MED ORDER — DULOXETINE HCL 30 MG PO CPEP
30.0000 mg | ORAL_CAPSULE | Freq: Every day | ORAL | Status: DC
  Administered 2014-08-27 – 2014-08-29 (×3): 30 mg via ORAL
  Filled 2014-08-26 (×3): qty 1

## 2014-08-26 MED ORDER — GABAPENTIN 600 MG PO TABS: 600.0000 mg | ORAL_TABLET | Freq: Four times a day (QID) | ORAL | Status: DC

## 2014-08-26 MED ORDER — OXYCODONE HCL 5 MG PO TABS
5.0000 mg | ORAL_TABLET | ORAL | Status: DC | PRN
  Administered 2014-08-27 – 2014-08-30 (×14): 5 mg via ORAL
  Filled 2014-08-26 (×14): qty 1

## 2014-08-26 MED ORDER — DEXTROSE 5 % IV SOLN: 2.0000 g | Freq: Once | INTRAVENOUS | Status: DC

## 2014-08-26 MED ORDER — LAMOTRIGINE 100 MG PO TABS
100.0000 mg | ORAL_TABLET | Freq: Four times a day (QID) | ORAL | Status: DC | PRN
  Administered 2014-08-28: 100 mg via ORAL
  Filled 2014-08-26 (×3): qty 1

## 2014-08-26 MED ORDER — GABAPENTIN 300 MG PO CAPS
300.0000 mg | ORAL_CAPSULE | Freq: Four times a day (QID) | ORAL | Status: DC
  Administered 2014-08-27 (×2): 300 mg via ORAL
  Filled 2014-08-26 (×5): qty 1

## 2014-08-26 NOTE — H&P (Addendum)
Hospitalist Admission History and Physical  Patient name: Miranda [name] Medical record number: [id number] Date of birth: Apr 08, 1966 Age: 48 y.o. Gender: female  Primary Care Provider: Elie Goody, NP  Chief Complaint: acute resp failure w/ hypoxia, HCAP  History of Present Illness:This is a 48 y.o. year old female with significant past medical history of CAP, lyme disease, fibromyalgia, prior hx/o alcoholism presenting with acute resp failure with hypoxia. Pt noted to had been admitted October 1 for presumed community-acquired pneumonia. Was placed on Rocephin and azithromycin. Patient states that she's had progressive cough, increased work of breathing, fevers and chills over the past week. Nonsmoker. Minimal chest pain-pleuritic in nature. States that she has been compliant with medication regimen from before. Has also chronic/recurrent Lyme disease. Is followed by specialist in [place]. States this is been stable. Present to the ER MAXIMUM TEMPERATURE 99.4, heart rate in the 90s, respirations in the tens to 20s, blood pressure in the 120s to 130s. Satting in the mid 80s. In the upper 90s on 2 L nasal cannula.White blood cell count 11.6, hemoglobin 8.9, creatinine 0.78. Glucose 104.chest x-ray shows severe bronchitis and superimposed infiltrates. Started on healthcare associated pneumonia treatment with vancomycin and cefepime. CT angiogram pending. EKG sinus tach.  Assessment and Plan: Miranda [name] is a 48 y.o. year old female presenting with acute resp failure with hypoxia   Active Problems:   HCAP (healthcare-associated pneumonia)   1- Acute resp failure with hypoxia -Likely secondary to HCAP  -vanc and cefepime -panculture -noted pleuritic chest pain-highly atypical- EKG w/ sinus tach. Trop pending.  -CTA pending -tele bed   2-Anemia -unclear etiology  -no overt signs of bleeding -check anemia panel   3- Chronic pain  -cont home regimen   4- lyme disease -unclear of  regimen -no current severe pain from this per pt.  -may need to reconcile  -follow   FEN/GI: heart healthy diet  Prophylaxis: sub 1 heparin  Disposition: pending furhter evaluation  Code Status:Full Code    Patient Active Problem List   Diagnosis Date Noted  . HCAP (healthcare-associated pneumonia) 08/26/2014  . Fever 07/25/2014  . History of Lyme disease 07/25/2014  . Facet arthropathy, lumbar 08/15/2013  . Adult ADHD 07/03/2013  . Alcoholism in recovery 07/03/2013  . Depression 12/20/2012   Past Medical History: Past Medical History  Diagnosis Date  . ADHD (attention deficit hyperactivity disorder)   . Depression   . GERD (gastroesophageal reflux disease)   . Alcoholism in recovery   . Fibromyalgia syndrome     Extensive rheum w/u by Dr. Kathi Ludwig (Rheum) 06/2013 --all NORMAL.  Marland Kitchen Abnormal Pap smear of cervix 2012  . Lyme disease     Past Surgical History: Past Surgical History  Procedure Laterality Date  . Hernia repair      Right inguinal and right femoral hernia (2 yrs apart, both repaired by Dr. Gerrit Friends)  . Hand surgery Left 2004    Cat bite--I&D for infection  . Colposcopy      Social History: History   Social History  . Marital Status: Single    Spouse Name: N/A    Number of Children: N/A  . Years of Education: N/A   Social History Main Topics  . Smoking status: Former Smoker -- 1.00 packs/day for 15 years    Types: Cigarettes    Quit date: 11/01/2011  . Smokeless tobacco: Never Used  . Alcohol Use: No  . Drug Use: No  . Sexual Activity: No  Other Topics Concern  . None   Social History Narrative   [place]ives [place] in [place].  Orig from [place].   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   Ed level: Masters Degree   Tob 10 pack yr hx quit 2 yrs ago.   Alcoholism: quit 2009.   Regular exercise: not at this time   Caffeine use: daily    Family History: Family History  Problem Relation Age of Onset  . Diabetes Mother   .  Hypertension Mother   . Heart disease Mother   . Hypertension Father   . Cancer Father     Prostate and melanoma  . Cancer Maternal Grandmother   . Heart disease Maternal Grandfather   . Heart disease Paternal Grandmother   . Heart disease Paternal Grandfather     Allergies: Allergies  Allergen Reactions  . Amoxicillin     Stomach pain    Current Facility-Administered Medications  Medication Dose Route Frequency Provider Last Rate Last Dose  . ceFEPIme (MAXIPIME) 1 g in dextrose 5 % 50 mL IVPB  1 g Intravenous Once Christopher W Lawyer, PA-C      . ceFEPIme (MAXIPIME) 1 g in dextrose 5 % 50 mL IVPB  1 g Intravenous 3 times per day Doree AlbeeSteven Donelle Baba, MD      . heparin injection 5,000 Units  5,000 Units Subcutaneous 3 times per day Doree AlbeeSteven Johnette Teigen, MD      . Melene Muller[START ON 08/27/2014] vancomycin (VANCOCIN) 1,250 mg in sodium chloride 0.9 % 250 mL IVPB  1,250 mg Intravenous Q12H Otho Bellowserri L Green, RPH      . vancomycin (VANCOCIN) 1,500 mg in sodium chloride 0.9 % 500 mL IVPB  1,500 mg Intravenous Once Otho Bellowserri L Green, [facility] Surgery[facility]PH       Current Outpatient Prescriptions  Medication Sig Dispense Refill  . atovaquone (MEPRON) 750 MG/5ML suspension Take 1,500 mg by mouth daily. Twice a day on Monday, Wednesday, Friday, and Sunday Last dose on 10/10    . clonazePAM (KLONOPIN) 1 MG tablet Take 1 mg by mouth at bedtime.    . DULoxetine (CYMBALTA) 30 MG capsule Take 30 mg by mouth daily.    Marland Kitchen. gabapentin (NEURONTIN) 300 MG capsule Take 300 mg by mouth 4 (four) times daily. May titratate up as needed. Takes with 600mg  capsule for a total of 900mg  per dose    . gabapentin (NEURONTIN) 600 MG tablet Take 600 mg by mouth 4 (four) times daily. Takes with 300mg  capsule for a total of 900mg  per dose    . LACTATED RINGERS IV Inject 600 mg into the vein daily.     Marland Kitchen. lamoTRIgine (LAMICTAL) 100 MG tablet Take 100 mg by mouth 4 (four) times daily as needed (for pain).     . LORazepam (ATIVAN) 2 MG tablet Take 1 tablet (2 mg total)  by mouth 2 (two) times daily as needed for anxiety. (Patient taking differently: Take 2-4 mg by mouth 4 (four) times daily. ) 30 tablet 0  . Omega-3 Fatty Acids (FISH OIL) 1000 MG CAPS Take 1,000 mg by mouth 2 (two) times daily.    . ondansetron (ZOFRAN) 4 MG tablet Take 4 mg by mouth daily.    Marland Kitchen. OVER THE COUNTER MEDICATION Take 1,000 mg by mouth See admin instructions. Artemisin 1000mg   Twice a day on Monday, Wednesday, Friday, and Sunday Last dose on 10/10    . OVER THE COUNTER MEDICATION Take 10 drops by mouth See admin instructions. Enula 10 drops  every 8 days for 8 days. 8 days on 8 days off    . OVER THE COUNTER MEDICATION Take 5,000 mg by mouth daily. Nettokinase 100mg     . OVER THE COUNTER MEDICATION Take 500 mg by mouth 2 (two) times daily. Lactoferrin    . OxyCODONE (OXYCONTIN) 20 mg T12A 12 hr tablet Take 20 mg by mouth every 12 (twelve) hours.    Marland Kitchen. oxyCODONE-acetaminophen (PERCOCET) 7.5-325 MG per tablet Take 2 tablets by mouth every 4 (four) hours as needed for pain.    . promethazine (PHENERGAN) 25 MG tablet Take 1 tablet (25 mg total) by mouth every 6 (six) hours as needed for nausea or vomiting. 30 tablet 1  . sulfamethoxazole-trimethoprim (BACTRIM DS) 800-160 MG per tablet Take 1 tablet by mouth See admin instructions. Monday, Wednesday, Friday, Sunday  Last dose 10/10    . Xylitol POWD Take by mouth daily. 4 tsp every morning    . Ascorbic Acid (VITAMIN C) 1000 MG tablet Take 1,000 mg by mouth daily. Last dose on 10/10    . azithromycin 500 mg in dextrose 5 % 250 mL Inject 500 mg into the vein every evening. Monday, Wednesday, Friday, Sunday  Last 10/10    . ciprofloxacin (CIPRO IN D5W) 400 MG/200ML SOLN Inject 400 mg into the vein See admin instructions. Monday, Wednesday, Friday, and Sunday  Last dose on 10/10    . OVER THE COUNTER MEDICATION Take 500 mg by mouth See admin instructions. Last dose on 10/05    . oxyCODONE (OXY IR/ROXICODONE) 5 MG immediate release tablet Take 1  tablet (5 mg total) by mouth every 4 (four) hours as needed for moderate pain, severe pain or breakthrough pain. 45 tablet 0  . topiramate (TOPAMAX) 100 MG tablet Take 100 mg by mouth every evening. Last dose on 10/10    . TURMERIC CURCUMIN PO Take 200 mg by mouth 2 (two) times daily.     Review Of Systems: 12 point ROS negative except as noted above in HPI.  Physical Exam: Filed Vitals:   08/26/14 2024  BP: 120/66  Pulse: 95  Temp:   Resp: 20    General: cooperative and fatigued HEENT: PERRLA and extra ocular movement intact Heart: S1, S2 normal, no murmur, rub or gallop, regular rate and rhythm Lungs: mildly labored breathing, faint rales  Abdomen: obese abdomen, + bowel sounds, minimal to mild abd TTP-chronic-no worsening-declining imaging  Extremities: extremities normal, atraumatic, no cyanosis or edema Skin:no rashes Neurology: normal without focal findings  Labs and Imaging: Lab Results  Component Value Date/Time   NA 141 08/26/2014 07:34 PM   K 4.4 08/26/2014 07:34 PM   CL 103 08/26/2014 07:34 PM   CO2 25 08/26/2014 07:34 PM   BUN 11 08/26/2014 07:34 PM   CREATININE 0.78 08/26/2014 07:34 PM   CREATININE 0.80 07/30/2013 03:39 PM   GLUCOSE 104* 08/26/2014 07:34 PM   Lab Results  Component Value Date   WBC 11.6* 08/26/2014   HGB 8.9* 08/26/2014   HCT 27.5* 08/26/2014   MCV 92.9 08/26/2014   PLT 319 08/26/2014   Urinalysis    Component Value Date/Time   COLORURINE AMBER* 08/26/2014 1900   APPEARANCEUR CLEAR 08/26/2014 1900   LABSPEC 1.015 08/26/2014 1900   PHURINE 7.0 08/26/2014 1900   GLUCOSEU NEGATIVE 08/26/2014 1900   HGBUR TRACE* 08/26/2014 1900   BILIRUBINUR NEGATIVE 08/26/2014 1900   KETONESUR NEGATIVE 08/26/2014 1900   PROTEINUR NEGATIVE 08/26/2014 1900   UROBILINOGEN 0.2 08/26/2014 1900  NITRITE NEGATIVE 08/26/2014 1900   LEUKOCYTESUR NEGATIVE 08/26/2014 1900       Dg Chest 2 View  08/26/2014   CLINICAL DATA:  Chest pain and shortness of  Breath.  EXAM: CHEST  2 VIEW  COMPARISON:  07/26/2014  FINDINGS: The heart is borderline enlarged but stable. The mediastinal and hilar contours are within normal limits. The PICC line tip is in the distal SVC. There are Severe bronchitic type interstitial changes with findings suspicious for superimposed lower lobe infiltrates. No pleural effusion. The bony thorax is intact.  IMPRESSION: Severe bronchitis and superimposed infiltrates.a   Electronically Signed   By: Loralie Champagne M.D.   On: 08/26/2014 20:08           Doree Albee MD  Pager: [phone]

## 2014-08-26 NOTE — ED Notes (Signed)
Patient transported to CT 

## 2014-08-26 NOTE — ED Notes (Signed)
Switch patient over to Nasal canula per RN Jari Favrescar. Set at 2 LPM

## 2014-08-26 NOTE — Progress Notes (Signed)
ANTIBIOTIC CONSULT NOTE - INITIAL  Pharmacy Consult for Vancomycin Indication: pneumonia  Allergies  Allergen Reactions  . Amoxicillin     Stomach pain   Patient Measurements: Height: 5\' 6"  (167.6 cm) Weight: 200 lb (90.719 kg) IBW/kg (Calculated) : 59.3  Vital Signs: Temp: 99.4 F (37.4 C) (11/02 1711) Temp Source: Oral (11/02 1711) BP: 120/66 mmHg (11/02 2024) Pulse Rate: 95 (11/02 2024) Intake/Output from previous day:   Intake/Output from this shift:    Labs:  Recent Labs  08/26/14 1934  WBC 11.6*  HGB 8.9*  PLT 319  CREATININE 0.78   Estimated Creatinine Clearance: 97.6 mL/min (by C-G formula based on Cr of 0.78). No results for input(s): VANCOTROUGH, VANCOPEAK, VANCORANDOM, GENTTROUGH, GENTPEAK, GENTRANDOM, TOBRATROUGH, TOBRAPEAK, TOBRARND, AMIKACINPEAK, AMIKACINTROU, AMIKACIN in the last 72 hours.   Microbiology: No results found for this or any previous visit (from the past 720 hour(s)).  Medical History: Past Medical History  Diagnosis Date  . ADHD (attention deficit hyperactivity disorder)   . Depression   . GERD (gastroesophageal reflux disease)   . Alcoholism in recovery   . Fibromyalgia syndrome     Extensive rheum w/u by Dr. Kathi LudwigSyed (Rheum) 06/2013 --all NORMAL.  Marland Kitchen. Abnormal Pap smear of cervix 2012  . Lyme disease    Medications:  Scheduled:  Anti-infectives    Start     Dose/Rate Route Frequency Ordered Stop   08/26/14 2115  aztreonam (AZACTAM) 2 g in dextrose 5 % 50 mL IVPB  Status:  Discontinued     2 g100 mL/hr over 30 Minutes Intravenous  Once 08/26/14 2111 08/26/14 2144     Assessment: 48 yoF to ED via EMS, febrile x 2 days, SHOB with O2 sats 81%. Hx of Lyme dz. Begin Vancomycin per pharmacy. Note patient not PCN allergic, reaction to Amoxicillin is stomach upset.  Was on Cipro and Azithromycin IV via PICC line, Septra DS  M,W,F,Su for Lyme dz treatment PTA , last doses 10/10  Continues on Atovaquone bid M,W,F,Su?  CXray: severe  bronchitis and bilateral lower lobe infiltrates, no pleural effusion  Goal of Therapy:  Vancomycin trough level 15-20 mcg/ml  Plan:   Vancomycin 1500mg  x1, then 1250mg  q12   Chilton SiGreen, Miranda [name] 08/26/2014,9:50 PM

## 2014-08-26 NOTE — ED Notes (Signed)
Bed: WA07 Expected date:  Expected time:  Means of arrival:  Comments: EMS 

## 2014-08-26 NOTE — ED Notes (Signed)
Per EMS pt has fever x 2 days. Pt has hx of lyme disease with the highest fever 104 yesterday and 102 today. At scene c/o of SOB and O2 was 81% and is non re breather now and Sats 100% now and lungs are clear. BP 104/75, HR 99.

## 2014-08-26 NOTE — ED Notes (Signed)
Pt refusing vancomycin at this time, sts that it makes her "sick as a dog." Cefepime sent from pharmacy was 1g, waiting on 2g Cefepime to be sent up.

## 2014-08-27 ENCOUNTER — Encounter (HOSPITAL_COMMUNITY): Payer: Self-pay

## 2014-08-27 DIAGNOSIS — I517 Cardiomegaly: Secondary | ICD-10-CM

## 2014-08-27 DIAGNOSIS — I059 Rheumatic mitral valve disease, unspecified: Secondary | ICD-10-CM

## 2014-08-27 DIAGNOSIS — J811 Chronic pulmonary edema: Secondary | ICD-10-CM

## 2014-08-27 LAB — CBC WITH DIFFERENTIAL/PLATELET
Basophils Absolute: 0 10*3/uL (ref 0.0–0.1)
Basophils Relative: 0 % (ref 0–1)
Eosinophils Absolute: 0.2 10*3/uL (ref 0.0–0.7)
Eosinophils Relative: 2 % (ref 0–5)
HEMATOCRIT: 29 % — AB (ref 36.0–46.0)
HEMOGLOBIN: 9.2 g/dL — AB (ref 12.0–15.0)
LYMPHS ABS: 1.3 10*3/uL (ref 0.7–4.0)
LYMPHS PCT: 12 % (ref 12–46)
MCH: 29.7 pg (ref 26.0–34.0)
MCHC: 31.7 g/dL (ref 30.0–36.0)
MCV: 93.5 fL (ref 78.0–100.0)
MONO ABS: 0.7 10*3/uL (ref 0.1–1.0)
MONOS PCT: 7 % (ref 3–12)
NEUTROS ABS: 8.5 10*3/uL — AB (ref 1.7–7.7)
NEUTROS PCT: 79 % — AB (ref 43–77)
Platelets: 323 10*3/uL (ref 150–400)
RBC: 3.1 MIL/uL — AB (ref 3.87–5.11)
RDW: 13.3 % (ref 11.5–15.5)
WBC: 10.7 10*3/uL — AB (ref 4.0–10.5)

## 2014-08-27 LAB — VITAMIN B12: Vitamin B-12: >2000 pg/mL — ABNORMAL HIGH (ref 211–911)

## 2014-08-27 LAB — IRON AND TIBC
Iron: 12 ug/dL — ABNORMAL LOW (ref 42–135)
SATURATION RATIOS: 5 % — AB (ref 20–55)
TIBC: 260 ug/dL (ref 250–470)
UIBC: 248 ug/dL (ref 125–400)

## 2014-08-27 LAB — COMPREHENSIVE METABOLIC PANEL
ALBUMIN: 2.7 g/dL — AB (ref 3.5–5.2)
ALK PHOS: 87 U/L (ref 39–117)
ALT: 36 U/L — ABNORMAL HIGH (ref 0–35)
ANION GAP: 12 (ref 5–15)
AST: 35 U/L (ref 0–37)
BILIRUBIN TOTAL: 0.3 mg/dL (ref 0.3–1.2)
BUN: 10 mg/dL (ref 6–23)
CHLORIDE: 102 meq/L (ref 96–112)
CO2: 24 meq/L (ref 19–32)
CREATININE: 0.71 mg/dL (ref 0.50–1.10)
Calcium: 8.6 mg/dL (ref 8.4–10.5)
GFR calc Af Amer: >90 mL/min (ref >90)
GFR calc non Af Amer: >90 mL/min (ref >90)
Glucose, Bld: 122 mg/dL — ABNORMAL HIGH (ref 70–99)
Potassium: 4.3 mEq/L (ref 3.7–5.3)
Sodium: 138 mEq/L (ref 137–147)
Total Protein: 7 g/dL (ref 6.0–8.3)

## 2014-08-27 LAB — URINE CULTURE
Colony Count: NO GROWTH
Culture: NO GROWTH

## 2014-08-27 LAB — CLOSTRIDIUM DIFFICILE BY PCR: CDIFFPCR: NEGATIVE

## 2014-08-27 LAB — TROPONIN I
Troponin I: <0.3 ng/mL (ref <0.30)
Troponin I: <0.3 ng/mL (ref <0.30)

## 2014-08-27 LAB — FOLATE: Folate: 17.1 ng/mL

## 2014-08-27 LAB — FERRITIN: FERRITIN: 316 ng/mL — AB (ref 10–291)

## 2014-08-27 LAB — STREP PNEUMONIAE URINARY ANTIGEN: STREP PNEUMO URINARY ANTIGEN: NEGATIVE

## 2014-08-27 LAB — HIV ANTIBODY (ROUTINE TESTING W REFLEX): HIV 1&2 Ab, 4th Generation: NONREACTIVE

## 2014-08-27 LAB — MRSA PCR SCREENING: MRSA BY PCR: NEGATIVE

## 2014-08-27 LAB — LACTIC ACID, PLASMA: LACTIC ACID, VENOUS: 0.8 mmol/L (ref 0.5–2.2)

## 2014-08-27 LAB — PRO B NATRIURETIC PEPTIDE: Pro B Natriuretic peptide (BNP): 3152 pg/mL — ABNORMAL HIGH (ref 0–125)

## 2014-08-27 MED ORDER — FUROSEMIDE 10 MG/ML IJ SOLN
20.0000 mg | Freq: Once | INTRAMUSCULAR | Status: AC
  Administered 2014-08-27: 20 mg via INTRAVENOUS
  Filled 2014-08-27: qty 2

## 2014-08-27 MED ORDER — LEVOFLOXACIN IN D5W 750 MG/150ML IV SOLN
750.0000 mg | Freq: Every day | INTRAVENOUS | Status: DC
  Administered 2014-08-27: 750 mg via INTRAVENOUS
  Filled 2014-08-27: qty 150

## 2014-08-27 MED ORDER — FERROUS SULFATE 325 (65 FE) MG PO TABS
325.0000 mg | ORAL_TABLET | Freq: Two times a day (BID) | ORAL | Status: DC
  Administered 2014-08-27 – 2014-08-30 (×6): 325 mg via ORAL
  Filled 2014-08-27 (×9): qty 1

## 2014-08-27 MED ORDER — GABAPENTIN 300 MG PO CAPS
300.0000 mg | ORAL_CAPSULE | Freq: Four times a day (QID) | ORAL | Status: DC
  Administered 2014-08-27 (×3): 300 mg via ORAL
  Filled 2014-08-27 (×3): qty 1

## 2014-08-27 NOTE — Progress Notes (Signed)
[facility] PROGRESS NOTE  Miranda [name] [id number] DOB: 10/21/1966 DOA: 08/26/2014 PCP: Miranda GoodyWORRELL,TAMMY, NP  Assessment/Plan: 1-Acute Hypoxic Respiratory Failure:  Multifactorial PNA vs pulmonary edema.  CT angio negative for PE.  Will continue with treatment for PNA.  Will check lactic acid, monitor oxygen saturation. If BP stable will consider lasix.  Transfer to step down unit.  ID consulted.   2-Fever: Treat for PNA, follow Blood culture. If blood culture positive will need to remove PICC line.   3-History of lyme diseases; Patient follow with Dr Elvis CoilJemseck presume lyme specialist in [place]. Number [phone].  ID consulted for recommendation. Patient on Azithromycin and cipro IV at home.   4-Anemia; follow trend. Iron deficiency. Will start ferrous sulfate.      Code Status: Full Code.  Family Communication: Care discussed with patient.  Disposition Plan: Transfer to step down for better observation.    Consultants:  ID, Dr Luciana Axeomer.   Procedures:  ECHO; pending.   Antibiotics:  Levaquin 110-02  HPI/Subjective: Sick appearing, relates persistent cough and dyspnea.   Objective: Filed Vitals:   08/27/14 0437  BP: 119/64  Pulse: 90  Temp: 98.2 F (36.8 C)  Resp: 20    Intake/Output Summary (Last 24 hours) at 08/27/14 1011 Last data filed at 08/27/14 0900  Gross per 24 hour  Intake    480 ml  Output      0 ml  Net    480 ml   Filed Weights   08/26/14 1706 08/27/14 0008  Weight: 90.719 kg (200 lb) 113.989 kg (251 lb 4.8 oz)    Exam:   General:  Alert in no distress, no increase work of breathing  Cardiovascular: S 1, S 2 R, R, R   Respiratory: Bilateral crackles. No wheezing.   Abdomen: Bs present, soft, NT  Musculoskeletal: trace edema.   Data Reviewed: Basic Metabolic Panel:  Recent Labs Lab 08/26/14 1934 08/26/14 2246 08/27/14 0501  NA 141  --  138  K 4.4  --  4.3  CL 103  --  102  CO2 25  --  24  GLUCOSE 104*  --  122*   BUN 11  --  10  CREATININE 0.78 0.79 0.71  CALCIUM 8.1*  --  8.6   Liver Function Tests:  Recent Labs Lab 08/27/14 0501  AST 35  ALT 36*  ALKPHOS 87  BILITOT 0.3  PROT 7.0  ALBUMIN 2.7*   No results for input(s): LIPASE, AMYLASE in the last 168 hours. No results for input(s): AMMONIA in the last 168 hours. CBC:  Recent Labs Lab 08/26/14 1934 08/26/14 2246 08/27/14 0501  WBC 11.6* 11.6* 10.7*  NEUTROABS 9.3*  --  8.5*  HGB 8.9* 8.7* 9.2*  HCT 27.5* 26.7* 29.0*  MCV 92.9 93.0 93.5  PLT 319 319 323   Cardiac Enzymes:  Recent Labs Lab 08/26/14 2246 08/27/14 0501  TROPONINI <0.30 <0.30   BNP (last 3 results)  Recent Labs  08/27/14 0501  PROBNP 3152.0*   CBG: No results for input(s): GLUCAP in the last 168 hours.  No results found for this or any previous visit (from the past 240 hour(s)).   Studies: Dg Chest 2 View  08/26/2014   CLINICAL DATA:  Chest pain and shortness of Breath.  EXAM: CHEST  2 VIEW  COMPARISON:  07/26/2014  FINDINGS: The heart is borderline enlarged but stable. The mediastinal and hilar contours are within normal limits. The PICC line tip is in the distal SVC. There are  Severe bronchitic type interstitial changes with findings suspicious for superimposed lower lobe infiltrates. No pleural effusion. The bony thorax is intact.  IMPRESSION: Severe bronchitis and superimposed infiltrates.a   Electronically Signed   By: Loralie ChampagneMark  Gallerani M.D.   On: 08/26/2014 20:08   Ct Angio Chest Pe W/cm &/or Wo Cm  08/26/2014   CLINICAL DATA:  Cough and shortness of breath for 2 days.  Fevers.  EXAM: CT ANGIOGRAPHY CHEST WITH CONTRAST  TECHNIQUE: Multidetector CT imaging of the chest was performed using the standard protocol during bolus administration of intravenous contrast. Multiplanar CT image reconstructions and MIPs were obtained to evaluate the vascular anatomy.  CONTRAST:  100mL OMNIPAQUE IOHEXOL 350 MG/ML SOLN  COMPARISON:  None.  FINDINGS: Technically  adequate study with good opacification of the central and segmental pulmonary arteries. No focal filling defects demonstrated. No evidence of significant pulmonary embolus.  Mild cardiac enlargement. Normal caliber thoracic aorta. No aortic dissection. Left central venous catheter in place. No significant lymphadenopathy in the chest. Esophagus is mostly decompressed.  Small bilateral pleural effusions. Diffuse interstitial infiltration and diffuse ground-glass opacities throughout the lungs likely representing alveolar and interstitial pulmonary edema. Diffuse pneumonia could also potentially have this appearance. Visualized portions of the upper abdominal organs are grossly unremarkable.  Review of the MIP images confirms the above findings.  IMPRESSION: No evidence of significant pulmonary embolus. Cardiac enlargement with small bilateral pleural effusions and diffuse bilateral airspace and interstitial disease likely representing diffuse pulmonary edema.   Electronically Signed   By: Burman NievesWilliam  Stevens M.D.   On: 08/26/2014 22:50    Scheduled Meds: . atovaquone  1,500 mg Oral Daily  . clonazePAM  1 mg Oral QHS  . DULoxetine  30 mg Oral Daily  . gabapentin  300 mg Oral QID  . heparin  5,000 Units Subcutaneous 3 times per day  . levofloxacin (LEVAQUIN) IV  750 mg Intravenous QHS   Continuous Infusions:   Active Problems:   HCAP (healthcare-associated pneumonia)   Acute respiratory failure with hypoxia    Time spent: 35 minutes.     [name], [name]  [facility] Pager [phone]. If 7PM-7AM, please contact night-coverage at [email or web address], password [email or web address] 08/27/2014, 10:11 AM  LOS: 1 day

## 2014-08-27 NOTE — Progress Notes (Signed)
Patient states has allergy to Vancomycin causes anaphaxlysis reaction. MD notified. Orders received. SRP, RN

## 2014-08-27 NOTE — Progress Notes (Signed)
  Echocardiogram 2D Echocardiogram has been performed.  Arvil ChacoFoster, Kaylin Marcon 08/27/2014, 4:40 PM

## 2014-08-27 NOTE — Consult Note (Signed)
[facility]     Reason for Consult: ? HCAP    Referring Physician: Dr. Sunnie Nielsenegalado  Active Problems:   HCAP (healthcare-associated pneumonia)   Acute respiratory failure with hypoxia   . atovaquone  1,500 mg Oral Daily  . clonazePAM  1 mg Oral QHS  . DULoxetine  30 mg Oral Daily  . ferrous sulfate  325 mg Oral BID WC  . gabapentin  300 mg Oral QID  . heparin  5,000 Units Subcutaneous 3 times per day  . levofloxacin (LEVAQUIN) IV  750 mg Intravenous QHS    Recommendations: Stop antibiotics Consider diuretics  Assessment: She is hypoxic but afebrile, minimally elevated WBC, pulmonary edema and cardiomegaly on CT scan and on exam, BNP elevated and normal lactate c/w pulmonary edema.  I do not see any evidence of infection.    Part of exacerbating issue is infusion of antibiotics (that includes fluid) at home with her picc line for her "chronic lyme disease".   Of note, lyme disease is not a chronic disease (outside of post-lyme syndrome) and her current antibiotics for this are not generally recommended.    HIV negative.    Thanks for consult  Antibiotics: Atovaquone, levaquin  HPI: Miranda ButtersSusan C Frye is a 48 y.o. female with a reported history of lyme disease and she is on a non-tranditional regimen not known to be effective with antibiotics both in IV and orally who was initially hospitalized last month with fever, some cough and congestion and treated for CAP with some improvement and then presented again yesterday with cough, sob, hypoxia.  She reports subjective fever and chills.  She has hypoxia to 80%.  CT with pulmonary edema, cardiomegaly.     Review of Systems: A comprehensive review of systems was negative.  Past Medical History  Diagnosis Date  . ADHD (attention deficit hyperactivity disorder)   . Depression   . GERD (gastroesophageal reflux disease)   . Alcoholism in recovery   . Fibromyalgia syndrome     Extensive rheum w/u by Dr. Kathi LudwigSyed  (Rheum) 06/2013 --all NORMAL.  Marland Kitchen. Abnormal Pap smear of cervix 2012  . Lyme disease     History  Substance Use Topics  . Smoking status: Former Smoker -- 1.00 packs/day for 15 years    Types: Cigarettes    Quit date: 11/01/2011  . Smokeless tobacco: Never Used  . Alcohol Use: No    Family History  Problem Relation Age of Onset  . Diabetes Mother   . Hypertension Mother   . Heart disease Mother   . Hypertension Father   . Cancer Father     Prostate and melanoma  . Cancer Maternal Grandmother   . Heart disease Maternal Grandfather   . Heart disease Paternal Grandmother   . Heart disease Paternal Grandfather    Allergies  Allergen Reactions  . Vancomycin Anxiety  . Amoxicillin     Stomach pain    OBJECTIVE: Blood pressure 136/78, pulse 88, temperature 98.2 F (36.8 C), temperature source Oral, resp. rate 19, height 5\' 6"  (1.676 m), weight 251 lb 4.8 oz (113.989 kg), last menstrual period 08/05/2014, SpO2 98 %. General: awake, alert, distress with no specific complaints Skin: no rashes Lungs: bilateral crackles, L>R Cor: RRR without m Abdomen: soft, nt, nd Ext: 1+ edema  Microbiology: No results found for this or any previous visit (from the past 240 hour(s)).  Staci RighterOMER, Ruthe Roemer, MD [facility] [email or web address] [phone]  pager  [phone] cell 08/27/2014, 12:44 PM

## 2014-08-27 NOTE — Plan of Care (Signed)
Problem: ICU Phase Progression Outcomes Goal: Dyspnea controlled at rest Outcome: Progressing     

## 2014-08-27 NOTE — Plan of Care (Signed)
Problem: ICU Phase Progression Outcomes Goal: O2 sats trending toward baseline Outcome: Progressing     

## 2014-08-28 ENCOUNTER — Inpatient Hospital Stay (HOSPITAL_COMMUNITY): Payer: BC Managed Care – PPO

## 2014-08-28 DIAGNOSIS — J810 Acute pulmonary edema: Secondary | ICD-10-CM

## 2014-08-28 DIAGNOSIS — G894 Chronic pain syndrome: Secondary | ICD-10-CM

## 2014-08-28 LAB — CBC WITH DIFFERENTIAL/PLATELET
BASOS ABS: 0 10*3/uL (ref 0.0–0.1)
BASOS PCT: 0 % (ref 0–1)
EOS PCT: 2 % (ref 0–5)
Eosinophils Absolute: 0.2 10*3/uL (ref 0.0–0.7)
HEMATOCRIT: 32.8 % — AB (ref 36.0–46.0)
Hemoglobin: 10.6 g/dL — ABNORMAL LOW (ref 12.0–15.0)
Lymphocytes Relative: 23 % (ref 12–46)
Lymphs Abs: 1.8 10*3/uL (ref 0.7–4.0)
MCH: 29.5 pg (ref 26.0–34.0)
MCHC: 32.3 g/dL (ref 30.0–36.0)
MCV: 91.4 fL (ref 78.0–100.0)
MONO ABS: 0.7 10*3/uL (ref 0.1–1.0)
Monocytes Relative: 10 % (ref 3–12)
Neutro Abs: 4.9 10*3/uL (ref 1.7–7.7)
Neutrophils Relative %: 65 % (ref 43–77)
Platelets: 406 10*3/uL — ABNORMAL HIGH (ref 150–400)
RBC: 3.59 MIL/uL — ABNORMAL LOW (ref 3.87–5.11)
RDW: 12.4 % (ref 11.5–15.5)
WBC: 7.6 10*3/uL (ref 4.0–10.5)

## 2014-08-28 LAB — COMPREHENSIVE METABOLIC PANEL
ALBUMIN: 2.8 g/dL — AB (ref 3.5–5.2)
ALT: 28 U/L (ref 0–35)
AST: 20 U/L (ref 0–37)
Alkaline Phosphatase: 86 U/L (ref 39–117)
Anion gap: 17 — ABNORMAL HIGH (ref 5–15)
BUN: 11 mg/dL (ref 6–23)
CALCIUM: 9.1 mg/dL (ref 8.4–10.5)
CO2: 24 mEq/L (ref 19–32)
CREATININE: 0.64 mg/dL (ref 0.50–1.10)
Chloride: 103 mEq/L (ref 96–112)
GFR calc Af Amer: >90 mL/min (ref >90)
GFR calc non Af Amer: >90 mL/min (ref >90)
Glucose, Bld: 110 mg/dL — ABNORMAL HIGH (ref 70–99)
Potassium: 4 mEq/L (ref 3.7–5.3)
Sodium: 144 mEq/L (ref 137–147)
Total Bilirubin: 0.3 mg/dL (ref 0.3–1.2)
Total Protein: 7 g/dL (ref 6.0–8.3)

## 2014-08-28 LAB — TROPONIN I
Troponin I: <0.3 ng/mL (ref <0.30)
Troponin I: <0.3 ng/mL (ref <0.30)

## 2014-08-28 LAB — MAGNESIUM: MAGNESIUM: 2.1 mg/dL (ref 1.5–2.5)

## 2014-08-28 LAB — LEGIONELLA ANTIGEN, URINE

## 2014-08-28 MED ORDER — OXYCODONE HCL ER 20 MG PO T12A
20.0000 mg | EXTENDED_RELEASE_TABLET | Freq: Two times a day (BID) | ORAL | Status: DC
  Administered 2014-08-28 – 2014-08-30 (×5): 20 mg via ORAL
  Filled 2014-08-28 (×5): qty 1

## 2014-08-28 MED ORDER — LORAZEPAM 1 MG PO TABS: 2.0000 mg | ORAL_TABLET | Freq: Two times a day (BID) | ORAL | Status: DC | PRN

## 2014-08-28 MED ORDER — OXYCODONE HCL 5 MG PO TABS: 5.0000 mg | ORAL_TABLET | ORAL | Status: DC | PRN

## 2014-08-28 MED ORDER — GABAPENTIN 300 MG PO CAPS
900.0000 mg | ORAL_CAPSULE | ORAL | Status: DC
  Administered 2014-08-28 (×2): 900 mg via ORAL
  Filled 2014-08-28 (×2): qty 3

## 2014-08-28 MED ORDER — GABAPENTIN 300 MG PO CAPS
900.0000 mg | ORAL_CAPSULE | Freq: Four times a day (QID) | ORAL | Status: DC
  Administered 2014-08-28 – 2014-08-30 (×7): 900 mg via ORAL
  Filled 2014-08-28 (×10): qty 3

## 2014-08-28 MED ORDER — TOPIRAMATE 100 MG PO TABS
100.0000 mg | ORAL_TABLET | Freq: Every evening | ORAL | Status: DC
  Administered 2014-08-28: 100 mg via ORAL
  Filled 2014-08-28 (×3): qty 1

## 2014-08-28 MED ORDER — LORAZEPAM 1 MG PO TABS
2.0000 mg | ORAL_TABLET | Freq: Four times a day (QID) | ORAL | Status: DC | PRN
  Administered 2014-08-28 (×2): 2 mg via ORAL
  Filled 2014-08-28 (×2): qty 2

## 2014-08-28 MED ORDER — LAMOTRIGINE 100 MG PO TABS
100.0000 mg | ORAL_TABLET | ORAL | Status: DC
  Administered 2014-08-28 – 2014-08-30 (×7): 100 mg via ORAL
  Filled 2014-08-28 (×8): qty 1

## 2014-08-28 NOTE — Plan of Care (Signed)
Problem: ICU Phase Progression Outcomes Goal: O2 sats trending toward baseline Outcome: Progressing     

## 2014-08-28 NOTE — Plan of Care (Signed)
Problem: ICU Phase Progression Outcomes Goal: Dyspnea controlled at rest Outcome: Progressing     

## 2014-08-28 NOTE — Progress Notes (Signed)
[facility] PROGRESS NOTE  Sharene ButtersSusan C Frye ZOX:[id number] DOB: 06/03/1966 DOA: 08/26/2014 PCP: Elie GoodyWORRELL,TAMMY, NP  Assessment/Plan: 1-Acute Hypoxic Respiratory Failure:  Secondary to pulmonary edema.  CT angio negative for PE.  ID recommend stopping antibiotics.  Lactic acid normal.   2-Acute Pulmonary Edema; resolved with IV lasix. Repeated chest x ray negative for infiltrates or edema. Pulmonary edema probably from IV fluids patient gest at home. ECHO normal.   3-Fever: If blood culture positive will need to remove PICC line. Afebrile. Follow Blood culture;   4-History of lyme diseases; Patient follow with Dr Elvis CoilJemseck presume lyme specialist in [place]. Number [phone].  -ID consulted for recommendation. Patient on Azithromycin and cipro IV at home.  -Appreciate  Dr Luciana Axeomer recommendation. No need to resume this antibiotics.  -Will resume patient pain management. Gabapentin, Lamictal.   5-Anemia; follow trend. Iron deficiency. Started  ferrous sulfate.   6-Diarrhea; C diff negative.  7-Chronic pain; resume home dose gabapentin, Lamictal, OxyContin. Resume xanax for anxiety.    Code Status: Full Code.  Family Communication: Care discussed with patient.  Disposition Plan: transfer to telemetry in 24 to 48 hours.    Consultants:  ID, Dr Luciana Axeomer.   Procedures:  ECHO; Left ventricle: The cavity size was normal. Systolic function was normal. The estimated ejection fraction was in the range of 55% to 60%. Wall motion was normal; there were no regional wall motion abnormalities. Left ventricular diastolic function parameters were normal.  Antibiotics:  Levaquin 110-02--1104  HPI/Subjective: Complaining of pain, needs her medication for pain.   Objective: Filed Vitals:   08/28/14 1000  BP: 134/63  Pulse: 86  Temp:   Resp: 22    Intake/Output Summary (Last 24 hours) at 08/28/14 1250 Last data filed at 08/28/14 0500  Gross per 24 hour  Intake    700 ml   Output   3850 ml  Net  -3150 ml   Filed Weights   08/26/14 1706 08/27/14 0008 08/28/14 0400  Weight: 90.719 kg (200 lb) 113.989 kg (251 lb 4.8 oz) 108.7 kg (239 lb 10.2 oz)    Exam:   General:  Alert in mild distress. Needs her pain medications.   Cardiovascular: S 1, S 2 R, R, R   Respiratory: CTA  Abdomen: Bs present, soft, NT  Musculoskeletal: trace edema.   Data Reviewed: Basic Metabolic Panel:  Recent Labs Lab 08/26/14 1934 08/26/14 2246 08/27/14 0501 08/28/14 0405  NA 141  --  138 144  K 4.4  --  4.3 4.0  CL 103  --  102 103  CO2 25  --  24 24  GLUCOSE 104*  --  122* 110*  BUN 11  --  10 11  CREATININE 0.78 0.79 0.71 0.64  CALCIUM 8.1*  --  8.6 9.1   Liver Function Tests:  Recent Labs Lab 08/27/14 0501 08/28/14 0405  AST 35 20  ALT 36* 28  ALKPHOS 87 86  BILITOT 0.3 0.3  PROT 7.0 7.0  ALBUMIN 2.7* 2.8*   No results for input(s): LIPASE, AMYLASE in the last 168 hours. No results for input(s): AMMONIA in the last 168 hours. CBC:  Recent Labs Lab 08/26/14 1934 08/26/14 2246 08/27/14 0501 08/28/14 0405  WBC 11.6* 11.6* 10.7* 7.6  NEUTROABS 9.3*  --  8.5* 4.9  HGB 8.9* 8.7* 9.2* 10.6*  HCT 27.5* 26.7* 29.0* 32.8*  MCV 92.9 93.0 93.5 91.4  PLT 319 319 323 406*   Cardiac Enzymes:  Recent Labs Lab 08/26/14 2246 08/27/14 0501  08/27/14 1100  TROPONINI <0.30 <0.30 <0.30   BNP (last 3 results)  Recent Labs  08/27/14 0501  PROBNP 3152.0*   CBG: No results for input(s): GLUCAP in the last 168 hours.  Recent Results (from the past 240 hour(s))  Urine culture     Status: None   Collection Time: 08/26/14  7:00 PM  Result Value Ref Range Status   Specimen Description URINE, RANDOM  Final   Special Requests NONE  Final   Culture  Setup Time   Final    08/26/2014 22:54 Performed at [facility]    Colony Count NO GROWTH Performed at [facility]   Final   Culture NO GROWTH Performed at [facility]    Final   Report Status 08/27/2014 FINAL  Final  Culture, blood (routine x 2) Call MD if unable to obtain prior to antibiotics being given     Status: None (Preliminary result)   Collection Time: 08/26/14 10:46 PM  Result Value Ref Range Status   Specimen Description BLOOD LEFT ANTECUBITAL  Final   Special Requests BOTTLES DRAWN AEROBIC AND ANAEROBIC 5CC  Final   Culture  Setup Time   Final    08/27/2014 03:35 Performed at [facility]    Culture   Final           BLOOD CULTURE RECEIVED NO GROWTH TO DATE CULTURE WILL BE HELD FOR 5 DAYS BEFORE ISSUING A FINAL NEGATIVE REPORT Performed at [facility]    Report Status PENDING  Incomplete  Culture, blood (routine x 2) Call MD if unable to obtain prior to antibiotics being given     Status: None (Preliminary result)   Collection Time: 08/26/14 10:46 PM  Result Value Ref Range Status   Specimen Description BLOOD RIGHT HAND  Final   Special Requests BOTTLES DRAWN AEROBIC AND ANAEROBIC 5CC  Final   Culture  Setup Time   Final    08/27/2014 03:36 Performed at [facility]    Culture   Final           BLOOD CULTURE RECEIVED NO GROWTH TO DATE CULTURE WILL BE HELD FOR 5 DAYS BEFORE ISSUING A FINAL NEGATIVE REPORT Performed at [facility]    Report Status PENDING  Incomplete  MRSA PCR Screening     Status: None   Collection Time: 08/27/14 11:39 AM  Result Value Ref Range Status   MRSA by PCR NEGATIVE NEGATIVE Final    Comment:        The GeneXpert MRSA Assay (FDA approved for NASAL specimens only), is one component of a comprehensive MRSA colonization surveillance program. It is not intended to diagnose MRSA infection nor to guide or monitor treatment for MRSA infections.   Clostridium Difficile by PCR     Status: None   Collection Time: 08/27/14  1:52 PM  Result Value Ref Range Status   C difficile by pcr NEGATIVE NEGATIVE Final    Comment: Performed at [facility]     Studies: Dg Chest  2 View  08/26/2014   CLINICAL DATA:  Chest pain and shortness of Breath.  EXAM: CHEST  2 VIEW  COMPARISON:  07/26/2014  FINDINGS: The heart is borderline enlarged but stable. The mediastinal and hilar contours are within normal limits. The PICC line tip is in the distal SVC. There are Severe bronchitic type interstitial changes with findings suspicious for superimposed lower lobe infiltrates. No pleural effusion. The bony thorax is intact.  IMPRESSION: Severe bronchitis and superimposed infiltrates.a   Electronically Signed   By: Loralie Champagne M.D.   On: 08/26/2014 20:08   Ct Angio Chest Pe W/cm &/or Wo Cm  08/26/2014   CLINICAL DATA:  Cough and shortness of breath for 2 days.  Fevers.  EXAM: CT ANGIOGRAPHY CHEST WITH CONTRAST  TECHNIQUE: Multidetector CT imaging of the chest was performed using the standard protocol during bolus administration of intravenous contrast. Multiplanar CT image reconstructions and MIPs were obtained to evaluate the vascular anatomy.  CONTRAST:  OMNIPAQUE IOHEXOL 350 MG/ML SOLN  COMPARISON:  None.  FINDINGS: Technically adequate study with good opacification of the central and segmental pulmonary arteries. No focal filling defects demonstrated. No evidence of significant pulmonary embolus.  Mild cardiac enlargement. Normal caliber thoracic aorta. No aortic dissection. Left central venous catheter in place. No significant lymphadenopathy in the chest. Esophagus is mostly decompressed.  Small bilateral pleural effusions. Diffuse interstitial infiltration and diffuse ground-glass opacities throughout the lungs likely representing alveolar and interstitial pulmonary edema. Diffuse pneumonia could also potentially have this appearance. Visualized portions of the upper abdominal organs are grossly unremarkable.  Review of the MIP images confirms the above findings.  IMPRESSION: No evidence of significant pulmonary embolus. Cardiac enlargement with small bilateral pleural effusions  and diffuse bilateral airspace and interstitial disease likely representing diffuse pulmonary edema.   Electronically Signed   By: Burman Nieves M.D.   On: 08/26/2014 22:50   Dg Chest Port 1 View  08/28/2014   CLINICAL DATA:  Short of breath.  EXAM: PORTABLE CHEST - 1 VIEW  COMPARISON:  08/26/2014  FINDINGS: Improvement in bilateral airspace disease. Lungs are now clear. No infiltrate or effusion. Negative for heart failure. Left arm PICC tip in the SVC unchanged.  IMPRESSION: Resolution of bilateral airspace disease since the prior study. No acute abnormality.   Electronically Signed   By: Marlan Palau M.D.   On: 08/28/2014 09:21    Scheduled Meds: . atovaquone  1,500 mg Oral Daily  . clonazePAM  1 mg Oral QHS  . DULoxetine  30 mg Oral Daily  . ferrous sulfate  325 mg Oral BID WC  . gabapentin  900 mg Oral 3 times per day  . heparin  5,000 Units Subcutaneous 3 times per day  . lamoTRIgine  100 mg Oral 3 times per day  . OxyCODONE  20 mg Oral Q12H  . topiramate  100 mg Oral QPM   Continuous Infusions:   Active Problems:   HCAP (healthcare-associated pneumonia)   Acute respiratory failure with hypoxia    Time spent: 35 minutes.     Hartley Barefoot A  [facility] Pager [phone]. If 7PM-7AM, please contact night-coverage at [email or web address], password [email or web address] 08/28/2014, 12:50 PM  LOS: 2 days

## 2014-08-28 NOTE — Care Management Note (Addendum)
    Page 1 of 2   08/30/2014     4:11:03 PM CARE MANAGEMENT NOTE 08/30/2014  Patient:  Miranda [name],Miranda [name]   Account Number:  [id number]  Date Initiated:  08/28/2014  Documentation initiated by:  [name],Miranda  Subjective/Objective Assessment:   pul monary edema, pna with hx of lymes diease.     Action/Plan:   tbd   Anticipated DC Date:  08/30/2014   Anticipated DC Plan:  HOME/SELF CARE  In-house referral  NA      DC Planning Services  CM consult      PAC Choice  NA   Choice offered to / List presented to:  NA   DME arranged  NA      DME agency  NA     HH arranged  NA      HH agency  NA   Status of service:  Completed, signed off Medicare Important Message given?   (If response is "NO", the following Medicare IM given date fields will be blank) Date Medicare IM given:   Medicare IM given by:   Date Additional Medicare IM given:   Additional Medicare IM given by:    Discharge Disposition:  HOME/SELF CARE  Per UR Regulation:  Reviewed for med. necessity/level of care/duration of stay  If discussed at Long Length of Stay Meetings, dates discussed:    Comments:  08/30/14 Miranda Buxton RN,BSN NCM [phone] 4P-D/[name] HOME NO NEEDS OR ORDERS.  TRANSFER FROM SDU.NO ANTICIPATED D/[name] NEEDS.  [id number]/Miranda Earlene Plateravis, RN, BSN, CCM Chart reviewed. Discharge needs and patient's stay to be reviewed and followed by case manager.

## 2014-08-28 NOTE — Progress Notes (Signed)
Pt c/o neurontin not being scheduled like she takes at home. Midlevel paged awaiting call back.

## 2014-08-28 NOTE — Clinical Documentation Improvement (Signed)
Risk Factors: Pulmonary edema and cardiomegaly on CT scan, BNP elevated and normal lactate c/w pulmonary edema per 11/03 progress notes.  Diagnostics: 11/02: CT angio chest: Cardiac enlargement with small bilateral pleural effusions and diffuse bilateral airspace and interstitial disease likely representing diffuse pulmonary edema.   Possible Conditions?  >Acute Pulmonary Edema >Chronic Pulmonary Edema >Bilateral Pleural Effusions >Other >Not able to determine    Thank you,  Marciano SequinWanda Mathews-Bethea,RN,BSN, Clinical Documentation Specialist:  [phone]  [facility] Information Management [email or web address] [email or web address]

## 2014-08-28 NOTE — Progress Notes (Signed)
Pt complained chest pain that is below left breast. Describes pain as stabbing. EKG done. Shows NSR with prolonged QT. MD made aware of EKG reading. New orders placed. Pt made aware. Vwilliams,rn.

## 2014-08-29 DIAGNOSIS — G894 Chronic pain syndrome: Secondary | ICD-10-CM

## 2014-08-29 LAB — BASIC METABOLIC PANEL
ANION GAP: 13 (ref 5–15)
BUN: 13 mg/dL (ref 6–23)
CO2: 22 mEq/L (ref 19–32)
Calcium: 8.9 mg/dL (ref 8.4–10.5)
Chloride: 100 mEq/L (ref 96–112)
Creatinine, Ser: 0.7 mg/dL (ref 0.50–1.10)
GFR calc Af Amer: >90 mL/min (ref >90)
Glucose, Bld: 100 mg/dL — ABNORMAL HIGH (ref 70–99)
POTASSIUM: 3.5 meq/L — AB (ref 3.7–5.3)
SODIUM: 135 meq/L — AB (ref 137–147)

## 2014-08-29 LAB — RESPIRATORY VIRUS PANEL
ADENOVIRUS: NOT DETECTED
Influenza A H1: NOT DETECTED
Influenza A H3: NOT DETECTED
Influenza A: NOT DETECTED
Influenza B: NOT DETECTED
Metapneumovirus: NOT DETECTED
PARAINFLUENZA 2 A: NOT DETECTED
Parainfluenza 1: NOT DETECTED
Parainfluenza 3: NOT DETECTED
Respiratory Syncytial Virus A: NOT DETECTED
Respiratory Syncytial Virus B: NOT DETECTED
Rhinovirus: NOT DETECTED

## 2014-08-29 LAB — CBC
HCT: 32.6 % — ABNORMAL LOW (ref 36.0–46.0)
Hemoglobin: 10.7 g/dL — ABNORMAL LOW (ref 12.0–15.0)
MCH: 29.8 pg (ref 26.0–34.0)
MCHC: 32.8 g/dL (ref 30.0–36.0)
MCV: 90.8 fL (ref 78.0–100.0)
PLATELETS: 479 10*3/uL — AB (ref 150–400)
RBC: 3.59 MIL/uL — ABNORMAL LOW (ref 3.87–5.11)
RDW: 12.4 % (ref 11.5–15.5)
WBC: 8.8 10*3/uL (ref 4.0–10.5)

## 2014-08-29 LAB — TROPONIN I

## 2014-08-29 MED ORDER — PANTOPRAZOLE SODIUM 40 MG PO TBEC
40.0000 mg | DELAYED_RELEASE_TABLET | Freq: Every day | ORAL | Status: DC
  Administered 2014-08-29 – 2014-08-30 (×2): 40 mg via ORAL
  Filled 2014-08-29 (×2): qty 1

## 2014-08-29 MED ORDER — DULOXETINE HCL 20 MG PO CPEP
20.0000 mg | ORAL_CAPSULE | Freq: Every day | ORAL | Status: DC
  Administered 2014-08-30: 20 mg via ORAL
  Filled 2014-08-29: qty 1

## 2014-08-29 MED ORDER — POTASSIUM CHLORIDE CRYS ER 20 MEQ PO TBCR
40.0000 meq | EXTENDED_RELEASE_TABLET | Freq: Once | ORAL | Status: AC
  Administered 2014-08-29: 40 meq via ORAL
  Filled 2014-08-29: qty 2

## 2014-08-29 NOTE — Plan of Care (Signed)
Problem: Phase I Progression Outcomes Goal: O2 sats > or equal 90% or at baseline Outcome: Completed/Met Date Met:  08/29/14 Goal: Dyspnea controlled at rest Outcome: Completed/Met Date Met:  08/29/14 Goal: Hemodynamically stable Outcome: Completed/Met Date Met:  08/29/14

## 2014-08-29 NOTE — Progress Notes (Addendum)
[facility] PROGRESS NOTE  Miranda [name] DOA: 08/26/2014 PCP: Elie GoodyWORRELL,TAMMY, NP  Assessment/Plan: 48 year old recently treated for PNA, last admission. History of presume lyme on antibiotics for this ???, presents with worsening respiratory failure, hypoxia, fever. ID was consulted for recommendation of presume lyme diseases.   1-Acute Hypoxic Respiratory Failure:  Secondary to pulmonary edema.  CT angio negative for PE.  ID recommend stopping antibiotics.  Lactic acid normal.   2-Acute Pulmonary Edema; resolved with IV lasix. Repeated chest x ray negative for infiltrates or edema. Pulmonary edema probably from IV fluids patient gest at home. ECHO normal.   3-Fever: If blood culture positive will need to remove PICC line. Afebrile. Follow Blood culture; no growth to date.   4-History of lyme diseases; Patient follow with Dr Elvis CoilJemseck presume lyme specialist in [place]. -ID consulted for recommendation. Patient on Azithromycin and cipro IV at home.  -Appreciate  Dr Luciana Axeomer recommendation. No need to resume this antibiotics.  -Will resume patient pain management. Gabapentin, Lamictal.   5-Anemia; follow trend. Iron deficiency. Started  ferrous sulfate.   6-Diarrhea; C diff negative. Resolved, suspect secondary to withdrawal.  7-Chronic pain; resume home dose gabapentin, Lamictal, OxyContin. Resume xanax for anxiety.  8-Prolong Q t- Pharmacy to review medications. Replete K.    Code Status: Full Code.  Family Communication: Care discussed with patient.  Disposition Plan: transfer to telemetry/    Consultants:  ID, Dr Luciana Axeomer.   Procedures:  ECHO; Left ventricle: The cavity size was normal. Systolic function was normal. The estimated ejection fraction was in the range of 55% to 60%. Wall motion was normal; there were no regional wall motion abnormalities. Left ventricular diastolic function parameters were normal.  Antibiotics:  Levaquin  110-02--1104  HPI/Subjective: She is feeling better today, she is more calm, less anxious. Diarrhea resolved.  Denies dyspnea.   Objective: Filed Vitals:   08/29/14 0500  BP: 113/61  Pulse: 78  Temp:   Resp: 13    Intake/Output Summary (Last 24 hours) at 08/29/14 0813 Last data filed at 08/29/14 0600  Gross per 24 hour  Intake    720 ml  Output    850 ml  Net   -130 ml   Filed Weights   08/26/14 1706 08/27/14 0008 08/28/14 0400  Weight: 90.719 kg (200 lb) 113.989 kg (251 lb 4.8 oz) 108.7 kg (239 lb 10.2 oz)    Exam:   General:  Alert in mild distress. Needs her pain medications.   Cardiovascular: S 1, S 2 R, R, R   Respiratory: CTA  Abdomen: Bs present, soft, NT  Musculoskeletal: trace edema.   Data Reviewed: Basic Metabolic Panel:  Recent Labs Lab 08/26/14 1934 08/26/14 2246 08/27/14 0501 08/28/14 0405 08/28/14 1432 08/29/14 0230  NA 141  --  138 144  --  135*  K 4.4  --  4.3 4.0  --  3.5*  CL 103  --  102 103  --  100  CO2 25  --  24 24  --  22  GLUCOSE 104*  --  122* 110*  --  100*  BUN 11  --  10 11  --  13  CREATININE 0.78 0.79 0.71 0.64  --  0.70  CALCIUM 8.1*  --  8.6 9.1  --  8.9  MG  --   --   --   --  2.1  --    Liver Function Tests:  Recent Labs Lab 08/27/14 0501 08/28/14  0405  AST 35 20  ALT 36* 28  ALKPHOS 87 86  BILITOT 0.3 0.3  PROT 7.0 7.0  ALBUMIN 2.7* 2.8*   No results for input(s): LIPASE, AMYLASE in the last 168 hours. No results for input(s): AMMONIA in the last 168 hours. CBC:  Recent Labs Lab 08/26/14 1934 08/26/14 2246 08/27/14 0501 08/28/14 0405 08/29/14 0230  WBC 11.6* 11.6* 10.7* 7.6 8.8  NEUTROABS 9.3*  --  8.5* 4.9  --   HGB 8.9* 8.7* 9.2* 10.6* 10.7*  HCT 27.5* 26.7* 29.0* 32.8* 32.6*  MCV 92.9 93.0 93.5 91.4 90.8  PLT 319 319 323 406* 479*   Cardiac Enzymes:  Recent Labs Lab 08/27/14 0501 08/27/14 1100 08/28/14 1432 08/28/14 2037 08/29/14 0230  TROPONINI <0.30 <0.30 <0.30 <0.30 <0.30    BNP (last 3 results)  Recent Labs  08/27/14 0501  PROBNP 3152.0*   CBG: No results for input(s): GLUCAP in the last 168 hours.  Recent Results (from the past 240 hour(s))  Urine culture     Status: None   Collection Time: 08/26/14  7:00 PM  Result Value Ref Range Status   Specimen Description URINE, RANDOM  Final   Special Requests NONE  Final   Culture  Setup Time   Final    08/26/2014 22:54 Performed at [facility]    Colony Count NO GROWTH Performed at [facility]   Final   Culture NO GROWTH Performed at [facility]   Final   Report Status 08/27/2014 FINAL  Final  Culture, blood (routine x 2) Call MD if unable to obtain prior to antibiotics being given     Status: None (Preliminary result)   Collection Time: 08/26/14 10:46 PM  Result Value Ref Range Status   Specimen Description BLOOD LEFT ANTECUBITAL  Final   Special Requests BOTTLES DRAWN AEROBIC AND ANAEROBIC 5CC  Final   Culture  Setup Time   Final    08/27/2014 03:35 Performed at [facility]    Culture   Final           BLOOD CULTURE RECEIVED NO GROWTH TO DATE CULTURE WILL BE HELD FOR 5 DAYS BEFORE ISSUING A FINAL NEGATIVE REPORT Performed at [facility]    Report Status PENDING  Incomplete  Culture, blood (routine x 2) Call MD if unable to obtain prior to antibiotics being given     Status: None (Preliminary result)   Collection Time: 08/26/14 10:46 PM  Result Value Ref Range Status   Specimen Description BLOOD RIGHT HAND  Final   Special Requests BOTTLES DRAWN AEROBIC AND ANAEROBIC 5CC  Final   Culture  Setup Time   Final    08/27/2014 03:36 Performed at [facility]    Culture   Final           BLOOD CULTURE RECEIVED NO GROWTH TO DATE CULTURE WILL BE HELD FOR 5 DAYS BEFORE ISSUING A FINAL NEGATIVE REPORT Performed at [facility]    Report Status PENDING  Incomplete  MRSA PCR Screening     Status: None   Collection Time: 08/27/14 11:39  AM  Result Value Ref Range Status   MRSA by PCR NEGATIVE NEGATIVE Final    Comment:        The GeneXpert MRSA Assay (FDA approved for NASAL specimens only), is one component of a comprehensive MRSA colonization surveillance program. It is not intended to diagnose MRSA infection nor to guide or monitor treatment for MRSA infections.  Clostridium Difficile by PCR     Status: None   Collection Time: 08/27/14  1:52 PM  Result Value Ref Range Status   C difficile by pcr NEGATIVE NEGATIVE Final    Comment: Performed at [facility]     Studies: Dg Chest Port 1 View  08/28/2014   CLINICAL DATA:  Short of breath.  EXAM: PORTABLE CHEST - 1 VIEW  COMPARISON:  08/26/2014  FINDINGS: Improvement in bilateral airspace disease. Lungs are now clear. No infiltrate or effusion. Negative for heart failure. Left arm PICC tip in the SVC unchanged.  IMPRESSION: Resolution of bilateral airspace disease since the prior study. No acute abnormality.   Electronically Signed   By: Marlan Palauharles  Clark M.D.   On: 08/28/2014 09:21    Scheduled Meds: . atovaquone  1,500 mg Oral Daily  . clonazePAM  1 mg Oral QHS  . DULoxetine  30 mg Oral Daily  . ferrous sulfate  325 mg Oral BID WC  . gabapentin  900 mg Oral QID  . heparin  5,000 Units Subcutaneous 3 times per day  . lamoTRIgine  100 mg Oral 3 times per day  . OxyCODONE  20 mg Oral Q12H  . potassium chloride  40 mEq Oral Once  . potassium chloride  40 mEq Oral Once  . topiramate  100 mg Oral QPM   Continuous Infusions:   Active Problems:   HCAP (healthcare-associated pneumonia)   Acute respiratory failure with hypoxia   Acute pulmonary edema   Chronic pain syndrome    Time spent: 35 minutes.     [name], Nhyla Nappi A  [facility] Pager [phone]. If 7PM-7AM, please contact night-coverage at [email or web address], password [email or web address] 08/29/2014, 8:13 AM  LOS: 3 days

## 2014-08-29 NOTE — Progress Notes (Signed)
[facility]  Patient Status: Active (receiving services up to time of hospitalization)  [facility] is providing the following services: RN  If patient discharges after hours, please call [phone].   Lanae CrumblyKristen Hayworth 08/29/2014, 8:32 PM

## 2014-08-29 NOTE — Progress Notes (Signed)
Pt. complaining of chest pressure 3/10.  Preformed EKG.  EKG looks normal. Made MD. Aware.  Will continue to monitor.

## 2014-08-30 DIAGNOSIS — R030 Elevated blood-pressure reading, without diagnosis of hypertension: Secondary | ICD-10-CM

## 2014-08-30 DIAGNOSIS — K219 Gastro-esophageal reflux disease without esophagitis: Secondary | ICD-10-CM

## 2014-08-30 DIAGNOSIS — IMO0001 Reserved for inherently not codable concepts without codable children: Secondary | ICD-10-CM | POA: Insufficient documentation

## 2014-08-30 MED ORDER — FERROUS FUMARATE 325 (106 FE) MG PO TABS: 1.0000 | ORAL_TABLET | Freq: Two times a day (BID) | ORAL | Status: AC

## 2014-08-30 MED ORDER — HEPARIN SOD (PORK) LOCK FLUSH 100 UNIT/ML IV SOLN
250.0000 [IU] | INTRAVENOUS | Status: AC | PRN
  Administered 2014-08-30: 250 [IU]

## 2014-08-30 MED ORDER — ONDANSETRON HCL 4 MG PO TABS: 4.0000 mg | ORAL_TABLET | Freq: Three times a day (TID) | ORAL | Status: AC | PRN

## 2014-08-30 MED ORDER — PANTOPRAZOLE SODIUM 40 MG PO TBEC: 40.0000 mg | DELAYED_RELEASE_TABLET | Freq: Every day | ORAL | Status: DC

## 2014-08-30 NOTE — ED Provider Notes (Signed)
CSN: [id number]     Arrival date & time 08/26/14  1704 History   First MD Initiated Contact with Patient 08/26/14 1759     Chief Complaint  Patient presents with  . Fever  . Headache     (Consider location/radiation/quality/duration/timing/severity/associated sxs/prior Treatment) HPI Patient presents to the emergency department withcough and shortness of breath over the last week.  The patient states that she has been feeling feverish and was found to have an oxygen saturation of 81% on room air by EMS.  Patient states that she does not wear oxygen normally at home.  The patient states that she has been feeling like she has been wheezing as well.  Patient denies chest pain, nausea, vomiting, weakness, dizziness, blurred vision, back pain, neck pain, dysuria, or syncope.  Patient states that syncope.  The patient states nothing seems to make her condition better or worse.  Patient did not take any new medications prior to arrival for her symptoms she to be getting worse Past Medical History  Diagnosis Date  . ADHD (attention deficit hyperactivity disorder)   . Depression   . GERD (gastroesophageal reflux disease)   . Alcoholism in recovery   . Fibromyalgia syndrome     Extensive rheum w/u by Dr. Kathi LudwigSyed (Rheum) 06/2013 --all NORMAL.  Marland Kitchen. Abnormal Pap smear of cervix 2012  . Lyme disease    Past Surgical History  Procedure Laterality Date  . Hernia repair      Right inguinal and right femoral hernia (2 yrs apart, both repaired by Dr. Gerrit FriendsGerkin)  . Hand surgery Left 2004    Cat bite--I&D for infection  . Colposcopy     Family History  Problem Relation Age of Onset  . Diabetes Mother   . Hypertension Mother   . Heart disease Mother   . Hypertension Father   . Cancer Father     Prostate and melanoma  . Cancer Maternal Grandmother   . Heart disease Maternal Grandfather   . Heart disease Paternal Grandmother   . Heart disease Paternal Grandfather    History  Substance Use Topics  .  Smoking status: Former Smoker -- 1.00 packs/day for 15 years    Types: Cigarettes    Quit date: 11/01/2011  . Smokeless tobacco: Never Used  . Alcohol Use: No   OB History    No data available     Review of Systems All other systems negative except as documented in the HPI. All pertinent positives and negatives as reviewed in the HPI.   Allergies  Vancomycin and Amoxicillin  Home Medications   Prior to Admission medications   Medication Sig Start Date End Date Taking? Authorizing Provider  clonazePAM (KLONOPIN) 1 MG tablet Take 1 mg by mouth at bedtime.   Yes Historical Provider, MD  DULoxetine (CYMBALTA) 30 MG capsule Take 30 mg by mouth daily.   Yes Historical Provider, MD  gabapentin (NEURONTIN) 300 MG capsule Take 300 mg by mouth 4 (four) times daily. May titratate up as needed. Takes with 600mg  capsule for a total of 900mg  per dose   Yes Historical Provider, MD  gabapentin (NEURONTIN) 600 MG tablet Take 600 mg by mouth 4 (four) times daily. Takes with 300mg  capsule for a total of 900mg  per dose   Yes Historical Provider, MD  lamoTRIgine (LAMICTAL) 100 MG tablet Take 100 mg by mouth 4 (four) times daily as needed (for pain).    Yes Historical Provider, MD  LORazepam (ATIVAN) 2 MG tablet Take 1 tablet (  2 mg total) by mouth 2 (two) times daily as needed for anxiety. Patient taking differently: Take 2-4 mg by mouth 4 (four) times daily.  07/28/14  Yes Dorothea OgleIskra M Myers, MD  Omega-3 Fatty Acids (FISH OIL) 1000 MG CAPS Take 1,000 mg by mouth 2 (two) times daily.   Yes Historical Provider, MD  OVER THE COUNTER MEDICATION Take 1,000 mg by mouth See admin instructions. Artemisin 1000mg   Twice a day on Monday, Wednesday, Friday, and Sunday Last dose on 10/10   Yes Historical Provider, MD  OVER THE COUNTER MEDICATION Take 10 drops by mouth See admin instructions. Enula 10 drops every 8 days for 8 days. 8 days on 8 days off   Yes Historical Provider, MD  OVER THE COUNTER MEDICATION Take 5,000 mg  by mouth daily. Nettokinase 100mg    Yes Historical Provider, MD  OVER THE COUNTER MEDICATION Take 500 mg by mouth 2 (two) times daily. Lactoferrin   Yes Historical Provider, MD  OxyCODONE (OXYCONTIN) 20 mg T12A 12 hr tablet Take 20 mg by mouth every 12 (twelve) hours.   Yes Historical Provider, MD  oxyCODONE-acetaminophen (PERCOCET) 7.5-325 MG per tablet Take 2 tablets by mouth every 4 (four) hours as needed for pain.   Yes Historical Provider, MD  Xylitol POWD Take by mouth daily. 4 tsp every morning   Yes Historical Provider, MD  ferrous fumarate (HEMOCYTE - 106 MG FE) 325 (106 FE) MG TABS tablet Take 1 tablet (106 mg of iron total) by mouth 2 (two) times daily. 08/30/14   Vassie Lollarlos Madera, MD  ondansetron (ZOFRAN) 4 MG tablet Take 1 tablet (4 mg total) by mouth every 8 (eight) hours as needed for nausea or vomiting. 08/30/14   Vassie Lollarlos Madera, MD  oxyCODONE (OXY IR/ROXICODONE) 5 MG immediate release tablet Take 1 tablet (5 mg total) by mouth every 4 (four) hours as needed for moderate pain, severe pain or breakthrough pain. 07/28/14   Dorothea OgleIskra M Myers, MD  pantoprazole (PROTONIX) 40 MG tablet Take 1 tablet (40 mg total) by mouth daily. 08/30/14   Vassie Lollarlos Madera, MD  topiramate (TOPAMAX) 100 MG tablet Take 100 mg by mouth every evening. Last dose on 10/10    Historical Provider, MD   BP 154/95 mmHg  Pulse 98  Temp(Src) 98.2 F (36.8 C) (Oral)  Resp 16  Ht 5\' 6"  (1.676 m)  Wt 239 lb 10.2 oz (108.7 kg)  BMI 38.70 kg/m2  SpO2 100%  LMP 08/05/2014 Physical Exam  Constitutional: She is oriented to person, place, and time. She appears well-developed and well-nourished. No distress. She is not intubated.  HENT:  Head: Normocephalic and atraumatic.  Mouth/Throat: Oropharynx is clear and moist.  Eyes: Pupils are equal, round, and reactive to light.  Neck: Normal range of motion. Thyromegaly present.  Cardiovascular: Normal rate, regular rhythm and normal heart sounds.  Exam reveals no gallop and no friction  rub.   No murmur heard. Pulmonary/Chest: No accessory muscle usage. Tachypnea noted. No apnea and no bradypnea. She is not intubated. She has no decreased breath sounds. She has wheezes. She has rhonchi. She has no rales.  Musculoskeletal: She exhibits no edema.  Neurological: She is alert and oriented to person, place, and time. She exhibits normal muscle tone. Coordination normal.  Skin: Skin is warm and dry.  Nursing note and vitals reviewed.   ED Course  Procedures (including critical care time) Labs Review Labs Reviewed  CBC WITH DIFFERENTIAL - Abnormal; Notable for the following:    WBC  11.6 (*)    RBC 2.96 (*)    Hemoglobin 8.9 (*)    HCT 27.5 (*)    Neutrophils Relative % 79 (*)    Neutro Abs 9.3 (*)    All other components within normal limits  BASIC METABOLIC PANEL - Abnormal; Notable for the following:    Glucose, Bld 104 (*)    Calcium 8.1 (*)    All other components within normal limits  URINALYSIS, ROUTINE W REFLEX MICROSCOPIC - Abnormal; Notable for the following:    Color, Urine AMBER (*)    Hgb urine dipstick TRACE (*)    All other components within normal limits  CBC - Abnormal; Notable for the following:    WBC 11.6 (*)    RBC 2.87 (*)    Hemoglobin 8.7 (*)    HCT 26.7 (*)    All other components within normal limits  CBC WITH DIFFERENTIAL - Abnormal; Notable for the following:    WBC 10.7 (*)    RBC 3.10 (*)    Hemoglobin 9.2 (*)    HCT 29.0 (*)    Neutrophils Relative % 79 (*)    Neutro Abs 8.5 (*)    All other components within normal limits  COMPREHENSIVE METABOLIC PANEL - Abnormal; Notable for the following:    Glucose, Bld 122 (*)    Albumin 2.7 (*)    ALT 36 (*)    All other components within normal limits  VITAMIN B12 - Abnormal; Notable for the following:    Vitamin B-12 >2000 (*)    All other components within normal limits  IRON AND TIBC - Abnormal; Notable for the following:    Iron 12 (*)    Saturation Ratios 5 (*)    All other  components within normal limits  FERRITIN - Abnormal; Notable for the following:    Ferritin 316 (*)    All other components within normal limits  RETICULOCYTES - Abnormal; Notable for the following:    RBC. 2.87 (*)    All other components within normal limits  PRO B NATRIURETIC PEPTIDE - Abnormal; Notable for the following:    Pro B Natriuretic peptide (BNP) 3152.0 (*)    All other components within normal limits  CBC WITH DIFFERENTIAL - Abnormal; Notable for the following:    RBC 3.59 (*)    Hemoglobin 10.6 (*)    HCT 32.8 (*)    Platelets 406 (*)    All other components within normal limits  COMPREHENSIVE METABOLIC PANEL - Abnormal; Notable for the following:    Glucose, Bld 110 (*)    Albumin 2.8 (*)    Anion gap 17 (*)    All other components within normal limits  CBC - Abnormal; Notable for the following:    RBC 3.59 (*)    Hemoglobin 10.7 (*)    HCT 32.6 (*)    Platelets 479 (*)    All other components within normal limits  BASIC METABOLIC PANEL - Abnormal; Notable for the following:    Sodium 135 (*)    Potassium 3.5 (*)    Glucose, Bld 100 (*)    All other components within normal limits  URINE CULTURE  CULTURE, BLOOD (ROUTINE X 2)  CULTURE, BLOOD (ROUTINE X 2)  MRSA PCR SCREENING  RESPIRATORY VIRUS PANEL  CLOSTRIDIUM DIFFICILE BY PCR  CULTURE, EXPECTORATED SPUTUM-ASSESSMENT  GRAM STAIN  URINE MICROSCOPIC-ADD ON  HIV ANTIBODY (ROUTINE TESTING)  LEGIONELLA ANTIGEN, URINE  STREP PNEUMONIAE URINARY ANTIGEN  CREATININE, SERUM  FOLATE  TROPONIN I  TROPONIN I  TROPONIN I  LACTIC ACID, PLASMA  TROPONIN I  TROPONIN I  TROPONIN I  MAGNESIUM    Imaging Review No results found.   EKG Interpretation   Date/Time:  Monday August 26 2014 18:05:22 EST Ventricular Rate:  102 PR Interval:  149 QRS Duration: 83 QT Interval:  403 QTC Calculation: 525 R Axis:   76 Text Interpretation:  Sinus tachycardia Probable left atrial enlargement  Low voltage,  precordial leads Prolonged QT interval No significant change  since last tracing Confirmed by KNAPP  MD-J, JON ([id number]) on 08/26/2014  6:16:58 PM      MDM   Final diagnoses:  Fever  SOB (shortness of breath)  HCAP (healthcare-associated pneumonia)    Patient Will be admitted to the hospital for further evaluation and care of her healthcare associated pneumonia.    Carlyle Dolly, PA-C 08/31/14 0141  Linwood Dibbles, MD 09/01/14 [phone]

## 2014-08-30 NOTE — Plan of Care (Signed)
Problem: Phase II Progression Outcomes Goal: Activity at appropriate level-compared to baseline (UP IN CHAIR FOR HEMODIALYSIS)  Outcome: Completed/Met Date Met:  08/30/14

## 2014-08-30 NOTE — Discharge Summary (Signed)
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 Will discharge home and patient will follow with PCP in 10 days.  Diet recommendation: low sodium/heart healthy diet.  Filed Weights   08/26/14 1706 08/27/14 0008 08/28/14 0400  Weight: 90.719 kg (200 lb) 113.989 kg (251 lb 4.8 oz) 108.7 kg (239 lb 10.2 oz)    History of present illness:  48 year old recently treated for PNA, last admission. History of presume lyme and on chronic antibiotics for this ???, presents with worsening respiratory failure, hypoxia, fever.  Hospital Course:  1-Acute Hypoxic Respiratory Failure:  -Secondary to pulmonary edema.  -CT angio negative for PE.  -ID recommend stopping antibiotics (no signs of PNA and no needs for chronic tx for Lyme).  -Lactic acid normal.   2-Acute Pulmonary Edema; resolved with IV lasix. Repeated chest x ray negative for infiltrates or edema. Pulmonary edema probably from IV fluids and antibiotics infusion patient gest at home.  -ECHO normal.  -advise to follow low sodium diet  3-Fever:  -Blood cx's w/o growth -no further episodes of fever  4-History of lyme diseases; Patient follow with Dr Eusebio Friendly presume lyme specialist in [place]. -ID consulted for recommendation. Patient on Azithromycin and cipro IV at  home.  -Appreciate Dr Linus Salmons recommendations. No need to resume this antibiotics at discharge.  -patient will follow with her ID doctor and will request PICC line to be removed if he is in agreement to stop abx's.  5-Anemia:  -appears to be anemia of chronic disease with component of iron deficiency  -ferritin 319 and iron level 12 and saturation rate 5 -discharge on iron pills for supplementation  -will need screening work up if not already done for iron deficiency.  6-Diarrhea: C diff negative. Resolved, suspect secondary to mild opiates withdrawal.   7-Chronic pain and anxiety  -resume home dose gabapentin, Lamictal, OxyContin. Resume xanax for anxiety.   8-Prolong Q t- electrolytes repleted -felt to be secondary to home antibiotics regimen and PRN antiemetics -antibiotics discontinue -phenergan discontinue -no abnormalities seen on telemetry -patient asymptomatic -follow up with PCP for further evaluation and treatment   9-elevated BP: w/o prior history of HTN -will advise to follow low sodium diet -follow up with PCP; if at that time still elevated, then will need antihypertensive regimen  Procedures:  See below for x-ray reports   2-D Echo - Left ventricle: The cavity size was normal. Systolic function was normal. The estimated ejection fraction was in the range of 55% to 60%. Wall motion was normal; there were no regional wall motion abnormalities. Left ventricular diastolic function parameters were normal. - Aortic valve: There was no regurgitation. - Aortic root: The aortic root was normal in size. - Mitral valve: Structurally normal valve. There was mild regurgitation. - Left atrium: The atrium was mildly dilated. - Right ventricle: The cavity size was normal. Wall thickness was normal. Systolic function was normal. -  Right atrium: The atrium was mildly dilated. - Tricuspid valve: There was mild regurgitation. - Pulmonic valve: Structurally  normal valve. There was no regurgitation. - Pulmonary arteries: Systolic pressure was mildly increased. PA peak pressure: 40 mm Hg (S). - Inferior vena cava: The vessel was normal in size. - Pericardium, extracardiac: A trivial pericardial effusion was identified posterior to the heart.  Consultations:  ID  Discharge Exam: Filed Vitals:   08/30/14 1356  BP: 154/95  Pulse: 98  Temp: 98.2 F (36.8 C)  Resp: 16    General: afebrile, no CP and no SOB; reports no further epigastric discomfort after the use of PPI Cardiovascular: S1 and S2, no rubs or gallops Respiratory: no wheezing, no rales Abdomen: soft, NT, ND and with positive BS Extremities: no erythema, no cyanosis; LUe with picc line in place.  Discharge Instructions You were cared for by a hospitalist during your hospital stay. If you have any questions about your discharge medications or the care you received while you were in the hospital after you are discharged, you can call the unit and asked to speak with the hospitalist on call if the hospitalist that took care of you is not available. Once you are discharged, your primary care physician will handle any further medical issues. Please note that NO REFILLS for any discharge medications will be authorized once you are discharged, as it is imperative that you return to your primary care physician (or establish a relationship with a primary care physician if you do not have one) for your aftercare needs so that they can reassess your need for medications and monitor your lab values.  Discharge Instructions    Diet - low sodium heart healthy    Complete by:  As directed      Discharge instructions    Complete by:  As directed   Follow a low sodium diet (less than 2.5 Grams daily) Arrange follow up with PCP in 1 week Arrange close follow up with ID doctor and have your PICC line removed          Current Discharge Medication List    START taking these medications    Details  pantoprazole (PROTONIX) 40 MG tablet Take 1 tablet (40 mg total) by mouth daily. Qty: 30 tablet, Refills: 1      CONTINUE these medications which have CHANGED   Details  ondansetron (ZOFRAN) 4 MG tablet Take 1 tablet (4 mg total) by mouth every 8 (eight) hours as needed for nausea or vomiting.      CONTINUE these medications which have NOT CHANGED   Details  clonazePAM (KLONOPIN) 1 MG tablet Take 1 mg by mouth at bedtime.    DULoxetine (CYMBALTA) 30 MG capsule Take 30 mg by mouth daily.    gabapentin (NEURONTIN) 300 MG capsule Take 300 mg by mouth 4 (four) times daily. May titratate up as needed. Takes with 635m capsule for a total of 902mper dose    gabapentin (NEURONTIN) 600 MG tablet Take 600 mg by mouth 4 (four) times daily. Takes with 30073mapsule for a total of 900m35mr dose    lamoTRIgine (LAMICTAL) 100 MG tablet Take 100 mg by mouth 4 (four) times daily as needed (for pain).     LORazepam (ATIVAN) 2 MG tablet Take 1 tablet (2 mg total) by mouth 2 (two) times daily as needed for anxiety. Qty: 30 tablet, Refills: 0    Omega-3 Fatty Acids (FISH OIL) 1000 MG CAPS Take 1,000 mg by  mouth 2 (two) times daily.    !! OVER THE COUNTER MEDICATION Take 1,000 mg by mouth See admin instructions. Artemisin 1089m  Twice a day on Monday, Wednesday, Friday, and Sunday Last dose on 10/10    !! OVER THE COUNTER MEDICATION Take 10 drops by mouth See admin instructions. Enula 10 drops every 8 days for 8 days. 8 days on 8 days off    !! OVER THE COUNTER MEDICATION Take 5,000 mg by mouth daily. Nettokinase 109m   !! OVER THE COUNTER MEDICATION Take 500 mg by mouth 2 (two) times daily. Lactoferrin    OxyCODONE (OXYCONTIN) 20 mg T12A 12 hr tablet Take 20 mg by mouth every 12 (twelve) hours.    oxyCODONE-acetaminophen (PERCOCET) 7.5-325 MG per tablet Take 2 tablets by mouth every 4 (four) hours as needed for pain.    Xylitol POWD Take by mouth daily. 4 tsp every morning     oxyCODONE (OXY IR/ROXICODONE) 5 MG immediate release tablet Take 1 tablet (5 mg total) by mouth every 4 (four) hours as needed for moderate pain, severe pain or breakthrough pain. Qty: 45 tablet, Refills: 0   Associated Diagnoses: Facet arthropathy, lumbar    topiramate (TOPAMAX) 100 MG tablet Take 100 mg by mouth every evening. Last dose on 10/10     !! - Potential duplicate medications found. Please discuss with provider.    STOP taking these medications     atovaquone (MEPRON) 750 MG/5ML suspension      LACTATED RINGERS IV      promethazine (PHENERGAN) 25 MG tablet      sulfamethoxazole-trimethoprim (BACTRIM DS) 800-160 MG per tablet      Ascorbic Acid (VITAMIN C) 1000 MG tablet      azithromycin 500 mg in dextrose 5 % 250 mL      ciprofloxacin (CIPRO IN D5W) 400 MG/200ML SOLN      TURMERIC CURCUMIN PO        Allergies  Allergen Reactions  . Vancomycin Anxiety  . Amoxicillin     Stomach pain    The results of significant diagnostics from this hospitalization (including imaging, microbiology, ancillary and laboratory) are listed below for reference.    Significant Diagnostic Studies: Dg Chest 2 View  08/26/2014   CLINICAL DATA:  Chest pain and shortness of Breath.  EXAM: CHEST  2 VIEW  COMPARISON:  07/26/2014  FINDINGS: The heart is borderline enlarged but stable. The mediastinal and hilar contours are within normal limits. The PICC line tip is in the distal SVC. There are Severe bronchitic type interstitial changes with findings suspicious for superimposed lower lobe infiltrates. No pleural effusion. The bony thorax is intact.  IMPRESSION: Severe bronchitis and superimposed infiltrates.a   Electronically Signed   By: MaKalman Jewels.D.   On: 08/26/2014 20:08   Ct Angio Chest Pe W/cm &/or Wo Cm  08/26/2014   CLINICAL DATA:  Cough and shortness of breath for 2 days.  Fevers.  EXAM: CT ANGIOGRAPHY CHEST WITH CONTRAST  TECHNIQUE: Multidetector CT imaging of the chest was  performed using the standard protocol during bolus administration of intravenous contrast. Multiplanar CT image reconstructions and MIPs were obtained to evaluate the vascular anatomy.  CONTRAST:  10030mMNIPAQUE IOHEXOL 350 MG/ML SOLN  COMPARISON:  None.  FINDINGS: Technically adequate study with good opacification of the central and segmental pulmonary arteries. No focal filling defects demonstrated. No evidence of significant pulmonary embolus.  Mild cardiac enlargement. Normal caliber thoracic aorta. No aortic dissection. Left central  venous catheter in place. No significant lymphadenopathy in the chest. Esophagus is mostly decompressed.  Small bilateral pleural effusions. Diffuse interstitial infiltration and diffuse ground-glass opacities throughout the lungs likely representing alveolar and interstitial pulmonary edema. Diffuse pneumonia could also potentially have this appearance. Visualized portions of the upper abdominal organs are grossly unremarkable.  Review of the MIP images confirms the above findings.  IMPRESSION: No evidence of significant pulmonary embolus. Cardiac enlargement with small bilateral pleural effusions and diffuse bilateral airspace and interstitial disease likely representing diffuse pulmonary edema.   Electronically Signed   By: Lucienne Capers M.D.   On: 08/26/2014 22:50   Dg Chest Port 1 View  08/28/2014   CLINICAL DATA:  Short of breath.  EXAM: PORTABLE CHEST - 1 VIEW  COMPARISON:  08/26/2014  FINDINGS: Improvement in bilateral airspace disease. Lungs are now clear. No infiltrate or effusion. Negative for heart failure. Left arm PICC tip in the SVC unchanged.  IMPRESSION: Resolution of bilateral airspace disease since the prior study. No acute abnormality.   Electronically Signed   By: Franchot Gallo M.D.   On: 08/28/2014 09:21    Microbiology: Recent Results (from the past 240 hour(s))  Urine culture     Status: None   Collection Time: 08/26/14  7:00 PM  Result Value  Ref Range Status   Specimen Description URINE, RANDOM  Final   Special Requests NONE  Final   Culture  Setup Time   Final    08/26/2014 22:54 Performed at [facility] Performed at [facility]   Final   Culture NO GROWTH Performed at [facility]   Final   Report Status 08/27/2014 FINAL  Final  Culture, blood (routine x 2) Call MD if unable to obtain prior to antibiotics being given     Status: None (Preliminary result)   Collection Time: 08/26/14 10:46 PM  Result Value Ref Range Status   Specimen Description BLOOD LEFT ANTECUBITAL  Final   Special Requests BOTTLES DRAWN AEROBIC AND ANAEROBIC 5CC  Final   Culture  Setup Time   Final    08/27/2014 03:35 Performed at [facility]    Culture   Final           BLOOD CULTURE RECEIVED NO GROWTH TO DATE CULTURE WILL BE HELD FOR 5 DAYS BEFORE ISSUING A FINAL NEGATIVE REPORT Performed at [facility]    Report Status PENDING  Incomplete  Culture, blood (routine x 2) Call MD if unable to obtain prior to antibiotics being given     Status: None (Preliminary result)   Collection Time: 08/26/14 10:46 PM  Result Value Ref Range Status   Specimen Description BLOOD RIGHT HAND  Final   Special Requests BOTTLES DRAWN AEROBIC AND ANAEROBIC 5CC  Final   Culture  Setup Time   Final    08/27/2014 03:36 Performed at [facility]    Culture   Final           BLOOD CULTURE RECEIVED NO GROWTH TO DATE CULTURE WILL BE HELD FOR 5 DAYS BEFORE ISSUING A FINAL NEGATIVE REPORT Performed at [facility]    Report Status PENDING  Incomplete  MRSA PCR Screening     Status: None   Collection Time: 08/27/14 11:39 AM  Result Value Ref Range Status   MRSA by PCR NEGATIVE NEGATIVE Final    Comment:        The GeneXpert MRSA Assay (FDA  approved for NASAL specimens only), is one component of a comprehensive MRSA colonization surveillance program. It is not intended to  diagnose MRSA infection nor to guide or monitor treatment for MRSA infections.   Respiratory virus panel (routine influenza)     Status: None   Collection Time: 08/27/14  1:47 PM  Result Value Ref Range Status   Source - RVPAN NASOPHARYNGEAL SWAB  Corrected    Comment: CORRECTED ON 11/05 AT 2036: PREVIOUSLY REPORTED AS NASOPHARYNGEAL SWAB   Respiratory Syncytial Virus A NOT DETECTED  Final   Respiratory Syncytial Virus B NOT DETECTED  Final   Influenza A NOT DETECTED  Final   Influenza B NOT DETECTED  Final   Parainfluenza 1 NOT DETECTED  Final   Parainfluenza 2 NOT DETECTED  Final   Parainfluenza 3 NOT DETECTED  Final   Metapneumovirus NOT DETECTED  Final   Rhinovirus NOT DETECTED  Final   Adenovirus NOT DETECTED  Final   Influenza A H1 NOT DETECTED  Final   Influenza A H3 NOT DETECTED  Final    Comment: (NOTE)       Normal Reference Range for each Analyte: NOT DETECTED Testing performed using the Luminex xTAG Respiratory Viral Panel test kit. The analytical performance characteristics of this assay have been determined by [facility].  The modifications have not been cleared or approved by the FDA. This assay has been validated pursuant to the CLIA regulations and is used for clinical purposes. Performed at [facility]   Clostridium Difficile by PCR     Status: None   Collection Time: 08/27/14  1:52 PM  Result Value Ref Range Status   C difficile by pcr NEGATIVE NEGATIVE Final    Comment: Performed at [facility]: Basic Metabolic Panel:  Recent Labs Lab 08/26/14 1934 08/26/14 2246 08/27/14 0501 08/28/14 0405 08/28/14 1432 08/29/14 0230  NA 141  --  138 144  --  135*  K 4.4  --  4.3 4.0  --  3.5*  CL 103  --  102 103  --  100  CO2 25  --  24 24  --  22  GLUCOSE 104*  --  122* 110*  --  100*  BUN 11  --  10 11  --  13  CREATININE 0.78 0.79 0.71 0.64  --  0.70  CALCIUM 8.1*  --  8.6 9.1  --  8.9  MG  --   --   --   --  2.1   --    Liver Function Tests:  Recent Labs Lab 08/27/14 0501 08/28/14 0405  AST 35 20  ALT 36* 28  ALKPHOS 87 86  BILITOT 0.3 0.3  PROT 7.0 7.0  ALBUMIN 2.7* 2.8*   CBC:  Recent Labs Lab 08/26/14 1934 08/26/14 2246 08/27/14 0501 08/28/14 0405 08/29/14 0230  WBC 11.6* 11.6* 10.7* 7.6 8.8  NEUTROABS 9.3*  --  8.5* 4.9  --   HGB 8.9* 8.7* 9.2* 10.6* 10.7*  HCT 27.5* 26.7* 29.0* 32.8* 32.6*  MCV 92.9 93.0 93.5 91.4 90.8  PLT 319 319 323 406* 479*   Cardiac Enzymes:  Recent Labs Lab 08/27/14 0501 08/27/14 1100 08/28/14 1432 08/28/14 2037 08/29/14 0230  TROPONINI <0.30 <0.30 <0.30 <0.30 <0.30   BNP: BNP (last 3 results)  Recent Labs  08/27/14 0501  PROBNP 3152.0*    Signed:  Barton Dubois  [facility] 08/30/2014, 4:10 PM

## 2014-08-30 NOTE — Plan of Care (Signed)
Problem: Phase II Progression Outcomes Goal: Discharge plan remains appropriate-arrangements made Outcome: Completed/Met Date Met:  08/30/14     

## 2014-08-30 NOTE — Plan of Care (Signed)
Problem: Phase I Progression Outcomes Goal: Progress activity as tolerated unless otherwise ordered Outcome: Completed/Met Date Met:  08/30/14

## 2014-08-30 NOTE — Plan of Care (Signed)
Problem: Phase I Progression Outcomes Goal: Flu/PneumoVaccines if indicated Outcome: Not Applicable Date Met:  60/47/99 Goal: Pain controlled Outcome: Progressing Goal: Progress activity as tolerated unless otherwise ordered Outcome: Progressing Goal: Discharge plan established Outcome: Progressing Goal: Tolerating diet Outcome: Completed/Met Date Met:  08/30/14 Goal: Other Phase II Outcomes/Goals Outcome: Not Applicable Date Met:  87/21/58  Problem: Phase II Progression Outcomes Goal: O2 sats > equal to 90% on RA or at baseline Outcome: Completed/Met Date Met:  08/30/14 Goal: Pain controlled on oral analgesia Outcome: Progressing Goal: Dyspnea controlled w/progressive activity Outcome: Completed/Met Date Met:  08/30/14 Goal: ADLs completed with minimal assistance Outcome: Completed/Met Date Met:  08/30/14 Goal: Activity at appropriate level-compared to baseline (UP IN CHAIR FOR HEMODIALYSIS)  Outcome: Progressing Goal: Tolerating diet Outcome: Completed/Met Date Met:  08/30/14 Goal: Discharge plan remains appropriate-arrangements made Outcome: Progressing Goal: Other Phase II Outcomes/Goals Outcome: Not Applicable Date Met:  72/76/18

## 2014-08-30 NOTE — Plan of Care (Signed)
Problem: Phase I Progression Outcomes Goal: Discharge plan established Outcome: Completed/Met Date Met:  08/30/14

## 2014-09-02 LAB — CULTURE, BLOOD (ROUTINE X 2)
CULTURE: NO GROWTH
Culture: NO GROWTH

## 2014-12-10 NOTE — Telephone Encounter (Signed)
error 

## 2015-03-06 ENCOUNTER — Emergency Department (HOSPITAL_COMMUNITY)
Admission: EM | Admit: 2015-03-06 | Discharge: 2015-03-06 | Disposition: A | Discharge status: Home / Self Care | Payer: BC Managed Care – PPO | Attending: Emergency Medicine | Admitting: Emergency Medicine

## 2015-03-06 ENCOUNTER — Encounter (HOSPITAL_COMMUNITY): Payer: Self-pay

## 2015-03-06 ENCOUNTER — Emergency Department (HOSPITAL_COMMUNITY): Payer: BC Managed Care – PPO

## 2015-03-06 DIAGNOSIS — S7012XA Contusion of left thigh, initial encounter: Secondary | ICD-10-CM | POA: Diagnosis not present

## 2015-03-06 DIAGNOSIS — Z792 Long term (current) use of antibiotics: Secondary | ICD-10-CM | POA: Insufficient documentation

## 2015-03-06 DIAGNOSIS — S8992XA Unspecified injury of left lower leg, initial encounter: Secondary | ICD-10-CM | POA: Diagnosis present

## 2015-03-06 DIAGNOSIS — Y9389 Activity, other specified: Secondary | ICD-10-CM | POA: Insufficient documentation

## 2015-03-06 DIAGNOSIS — Y9289 Other specified places as the place of occurrence of the external cause: Secondary | ICD-10-CM | POA: Insufficient documentation

## 2015-03-06 DIAGNOSIS — F329 Major depressive disorder, single episode, unspecified: Secondary | ICD-10-CM | POA: Insufficient documentation

## 2015-03-06 DIAGNOSIS — Z79899 Other long term (current) drug therapy: Secondary | ICD-10-CM | POA: Insufficient documentation

## 2015-03-06 DIAGNOSIS — S93602A Unspecified sprain of left foot, initial encounter: Secondary | ICD-10-CM | POA: Diagnosis not present

## 2015-03-06 DIAGNOSIS — Z8719 Personal history of other diseases of the digestive system: Secondary | ICD-10-CM | POA: Insufficient documentation

## 2015-03-06 DIAGNOSIS — S93402A Sprain of unspecified ligament of left ankle, initial encounter: Secondary | ICD-10-CM

## 2015-03-06 DIAGNOSIS — Z8619 Personal history of other infectious and parasitic diseases: Secondary | ICD-10-CM | POA: Insufficient documentation

## 2015-03-06 DIAGNOSIS — S39012A Strain of muscle, fascia and tendon of lower back, initial encounter: Secondary | ICD-10-CM | POA: Diagnosis not present

## 2015-03-06 DIAGNOSIS — Z87891 Personal history of nicotine dependence: Secondary | ICD-10-CM | POA: Diagnosis not present

## 2015-03-06 DIAGNOSIS — S8012XA Contusion of left lower leg, initial encounter: Secondary | ICD-10-CM | POA: Diagnosis not present

## 2015-03-06 DIAGNOSIS — W108XXA Fall (on) (from) other stairs and steps, initial encounter: Secondary | ICD-10-CM | POA: Diagnosis not present

## 2015-03-06 DIAGNOSIS — S73102A Unspecified sprain of left hip, initial encounter: Secondary | ICD-10-CM

## 2015-03-06 DIAGNOSIS — Y998 Other external cause status: Secondary | ICD-10-CM | POA: Insufficient documentation

## 2015-03-06 DIAGNOSIS — Z880 Allergy status to penicillin: Secondary | ICD-10-CM | POA: Insufficient documentation

## 2015-03-06 DIAGNOSIS — R52 Pain, unspecified: Secondary | ICD-10-CM

## 2015-03-06 MED ORDER — OXYCODONE-ACETAMINOPHEN 5-325 MG PO TABS
1.0000 | ORAL_TABLET | Freq: Once | ORAL | Status: AC
  Administered 2015-03-06: 1 via ORAL
  Filled 2015-03-06: qty 1

## 2015-03-06 NOTE — ED Notes (Signed)
Patient transported to X-ray 

## 2015-03-06 NOTE — ED Provider Notes (Signed)
CSN: [id number]     Arrival date & time 03/06/15  1451 History   First MD Initiated Contact with Patient 03/06/15 1555     Chief Complaint  Patient presents with  . Leg Pain    Patient is a 49 y.o. female presenting with leg pain. The history is provided by the patient and a relative.  Leg Pain Location:  Leg Leg location:  L leg Pain details:    Quality:  Aching   Severity:  Moderate   Onset quality:  Sudden   Timing:  Constant   Progression:  Worsening Chronicity:  New Relieved by:  Rest Worsened by:  Activity and extension Associated symptoms: back pain   Associated symptoms: no neck pain   Pt reports mechanical fall last night  She reports falling down about 13 steps No head injury No LOC No neck pain She reports back pain She reports left ankle/knee/hip pain No CP/SOB No abd pain She is not on oral anticoagulants She is on home pain meds and is in pain clinic  Past Medical History  Diagnosis Date  . ADHD (attention deficit hyperactivity disorder)   . Depression   . GERD (gastroesophageal reflux disease)   . Alcoholism in recovery   . Fibromyalgia syndrome     Extensive rheum w/u by Dr. Kathi [name] ([facility]) 06/2013 --all NORMAL.  Marland Kitchen. Abnormal Pap smear of cervix 2012  . Lyme disease    Past Surgical History  Procedure Laterality Date  . Hernia repair      Right inguinal and right femoral hernia (2 yrs apart, both repaired by Dr. Gerrit [name])  . Hand surgery Left 2004    Cat bite--I&D for infection  . Colposcopy     Family History  Problem Relation Age of Onset  . Diabetes Mother   . Hypertension Mother   . Heart disease Mother   . Hypertension Father   . Cancer Father     Prostate and melanoma  . Cancer Maternal Grandmother   . Heart disease Maternal Grandfather   . Heart disease Paternal Grandmother   . Heart disease Paternal Grandfather    History  Substance Use Topics  . Smoking status: Former Smoker -- 1.00 packs/day for 15 years    Types: Cigarettes     Quit date: 11/01/2011  . Smokeless tobacco: Never Used  . Alcohol Use: No   OB History    No data available     Review of Systems  Cardiovascular: Negative for chest pain.  Gastrointestinal: Negative for abdominal pain.  Musculoskeletal: Positive for back pain and arthralgias. Negative for neck pain.  All other systems reviewed and are negative.     Allergies  Vancomycin and Amoxicillin  Home Medications   Prior to Admission medications   Medication Sig Start Date End Date Taking? Authorizing Provider  cefdinir (OMNICEF) 300 MG capsule Take 600 mg by mouth 2 (two) times daily. Lyme disease   Yes Historical Provider, MD  DULoxetine (CYMBALTA) 30 MG capsule Take 30 mg by mouth 3 (three) times daily.    Yes Historical Provider, MD  ferrous fumarate (HEMOCYTE - 106 MG FE) 325 (106 FE) MG TABS tablet Take 1 tablet (106 mg of iron total) by mouth 2 (two) times daily. 08/30/14  Yes Miranda Lollarlos Madera, MD  NUCYNTA 100 MG TABS Take 100 mg by mouth 4 (four) times daily as needed.  03/02/15  Yes Historical Provider, MD  nystatin (MYCOSTATIN) 100000 UNIT/ML suspension Take 1 mL by mouth 4 (four) times daily as  needed (For oral thrush caused by antibiotics).  02/26/15  Yes Historical Provider, MD  ondansetron (ZOFRAN) 4 MG tablet Take 1 tablet (4 mg total) by mouth every 8 (eight) hours as needed for nausea or vomiting. 08/30/14  Yes Miranda Lollarlos Madera, MD  OVER THE COUNTER MEDICATION Take 500 mg by mouth 2 (two) times daily. Lactoferrin   Yes Historical Provider, MD  pregabalin (LYRICA) 75 MG capsule Take 75 mg by mouth 3 (three) times daily.   Yes Historical Provider, MD  promethazine (PHENERGAN) 25 MG tablet 1 tablet daily as needed for nausea or vomiting.  02/26/15  Yes Historical Provider, MD  SUBOXONE 8-2 MG FILM Take 8 mg by mouth 3 (three) times daily. 03/02/15  Yes Historical Provider, MD  gabapentin (NEURONTIN) 300 MG capsule Take 300 mg by mouth 4 (four) times daily. May titratate up as needed. Takes  with 600mg  capsule for a total of 900mg  per dose    Historical Provider, MD  metroNIDAZOLE (FLAGYL) 500 MG tablet Take 500 mg by mouth 2 (two) times daily. Lyme disease    Historical Provider, MD  Omega-3 Fatty Acids (FISH OIL) 1000 MG CAPS Take 1,000 mg by mouth 2 (two) times daily.    Historical Provider, MD  rifabutin (MYCOBUTIN) 150 MG capsule 150 mg 2 (two) times daily. For Lyme Disease 02/27/15   Historical Provider, MD   BP 112/62 mmHg  Pulse 105  Temp(Src) 97.9 F (36.6 C) (Oral)  Resp 20  SpO2 96%  LMP 02/20/2015 Physical Exam CONSTITUTIONAL: Well developed/well nourished HEAD: Normocephalic/atraumatic EYES: EOMI/PERRL ENMT: Mucous membranes moist NECK: supple no meningeal signs SPINE/BACK:entire spine nontender, mild lumbar paraspinal tenderness with mild bruising.   CV: S1/S2 noted, no murmurs/rubs/gallops noted LUNGS: Lungs are clear to auscultation bilaterally, no apparent distress ABDOMEN: soft, nontender, no rebound or guarding, bowel sounds noted throughout abdomen GU:no cva tenderness NEURO: Pt is awake/alert/appropriate, moves all extremitiesx4.  No facial droop.   EXTREMITIES: pulses normal/equal, full ROM. Tenderness to left foot/ankle, and tenderness with ROM of left hip.  There is diffuse bruising to left tib/fib surface as well as left buttock (chaperone present).  She has no deformity to left LE.  The left calf is soft to palpation.  No crepitus.  There is a small blister to left tib surface SKIN: warm, color normal PSYCH: no abnormalities of mood noted, alert and oriented to situation  ED Course  Procedures  5:32 PM Pt with mechanical fall If imaging negative will d/c home She is on home suboxone/nucynta and is pain clinic - I don't feel comfortable prescribing narcotics for homegoing given history No signs of compartment syndrome  Pt improved Imaging negative She would like to try NSAIDs at home Discussed need for elevation/ice We discussed strict  return precautions   Imaging Review Dg Lumbar Spine Complete  03/06/2015   CLINICAL DATA:  Tripped and fell down stairs at home last night.  EXAM: LUMBAR SPINE - COMPLETE 4+ VIEW  COMPARISON:  None.  FINDINGS: There is no evidence of lumbar spine fracture. Alignment is normal. Intervertebral disc spaces are maintained.  IMPRESSION: Negative.   Electronically Signed   By: Ellery Plunkaniel R Mitchell M.D.   On: 03/06/2015 18:19   Dg Tibia/fibula Left  03/06/2015   CLINICAL DATA:  Recent tripping injury with leg pain, initial encounter  EXAM: LEFT TIBIA AND FIBULA - 2 VIEW  COMPARISON:  None.  FINDINGS: No acute fracture or dislocation is noted. Some rounded densities are noted in the soft tissues consistent  with the given clinical history of blistering. No large hematoma is noted. No knee joint effusion is seen.  IMPRESSION: No acute bony abnormality is seen.   Electronically Signed   By: Alcide Clever M.D.   On: 03/06/2015 15:47   Dg Ankle Complete Left  03/06/2015   CLINICAL DATA:  Tripping injury last evening with left leg pain, initial encounter  EXAM: LEFT ANKLE COMPLETE - 3+ VIEW  COMPARISON:  None.  FINDINGS: Generalized soft tissue swelling is noted about the left ankle. Small calcaneal spur is noted. No acute fracture or dislocation is seen.  IMPRESSION: Soft tissue swelling without acute bony abnormality.   Electronically Signed   By: Alcide Clever M.D.   On: 03/06/2015 15:43   Dg Foot Complete Left  03/06/2015   CLINICAL DATA:  Acute left foot pain after trip and fall at home last night. Initial encounter.  EXAM: LEFT FOOT - COMPLETE 3+ VIEW  COMPARISON:  None.  FINDINGS: There is no evidence of fracture or dislocation. Joint spaces are intact. Mild spurring of posterior calcaneus is noted. Soft tissues are unremarkable.  IMPRESSION: No acute abnormality seen in the left foot.   Electronically Signed   By: Lupita Raider, M.D.   On: 03/06/2015 15:47   Dg Hip Unilat With Pelvis 2-3 Views  Left  03/06/2015   CLINICAL DATA:  Tripped and fell down stairs at home last night  EXAM: LEFT HIP (WITH PELVIS) 2-3 VIEWS  COMPARISON:  None.  FINDINGS: There is no evidence of hip fracture or dislocation. There is no evidence of arthropathy or other focal bone abnormality.  IMPRESSION: Negative.   Electronically Signed   By: Ellery Plunk M.D.   On: 03/06/2015 18:19    Medications  oxyCODONE-acetaminophen (PERCOCET/ROXICET) 5-325 MG per tablet 1 tablet (1 tablet Oral Given 03/06/15 1636)   Pt reported she would not violate pain contract with pain meds given in ED  MDM   Final diagnoses:  Pain  Contusion of left thigh, initial encounter  Contusion of left leg, initial encounter  Sprain of left foot, initial encounter  Sprain of left ankle, initial encounter  Sprain of left hip, initial encounter  Lumbar strain, initial encounter    Nursing notes including past medical history and social history reviewed and considered in documentation xrays/imaging reviewed by myself and considered during evaluation     Zadie Rhine, MD 03/06/15 [phone]

## 2015-03-06 NOTE — ED Notes (Signed)
Pt, being [facility] [employer], c/o LLE fracture after a fall, last night.  PA would like to r/o compartment syndrome.

## 2015-03-06 NOTE — ED Notes (Signed)
Pt tripped down stairs. Not dizzy or light headed at time.  Pt states occurred last night.  Pt c/o pain from left leg down to foot.

## 2015-03-19 IMAGING — CT CT ANGIO CHEST
1 of 2 series · 19 of 32 positions shown · IV contrast (OMNIPAQUE 350)
Comparison: None.

CLINICAL DATA: Cough and shortness of breath for 2 days.  Fevers.

EXAM:
CT ANGIOGRAPHY CHEST WITH CONTRAST
TECHNIQUE: Multidetector CT imaging of the chest was performed using the
standard protocol during bolus administration of intravenous
contrast. Multiplanar CT image reconstructions and MIPs were
obtained to evaluate the vascular anatomy.
CONTRAST:  100mL OMNIPAQUE IOHEXOL 350 MG/ML SOLN

[Series 10: thins for pacs · axial · 0.74mm/px · z∈[+1948,+2176]mm · 19 of 254 slices shown]
[im 13/254  lung]
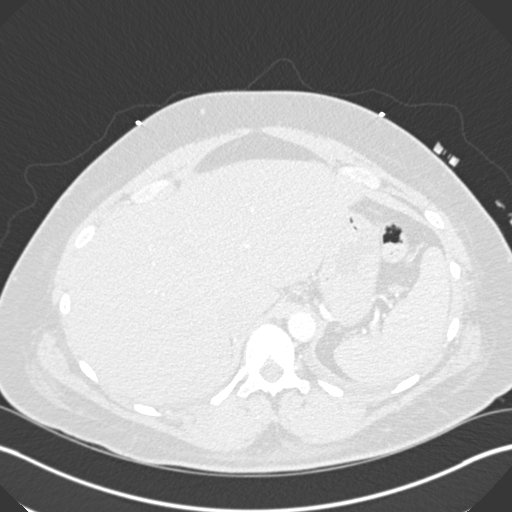
[im 26/254  mediastinal]
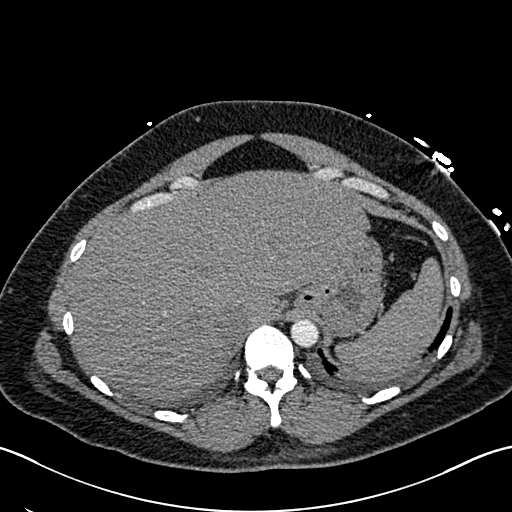
[im 38/254  lung]
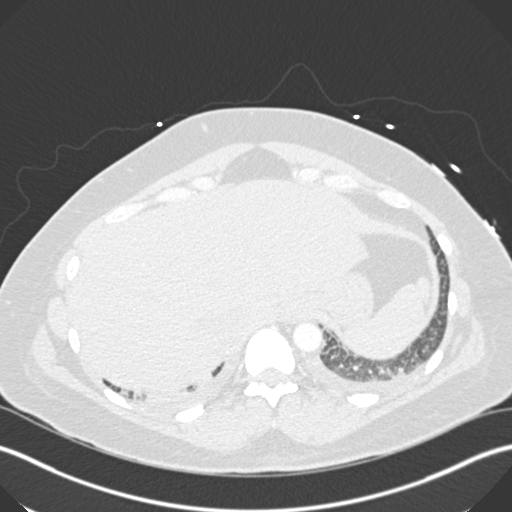
[im 64/254  mediastinal]
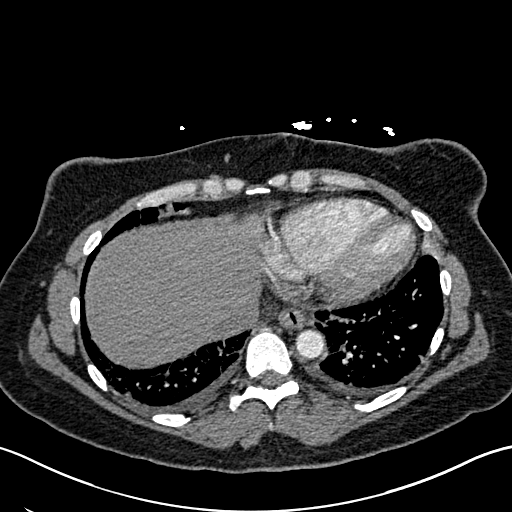
[im 76/254  lung]
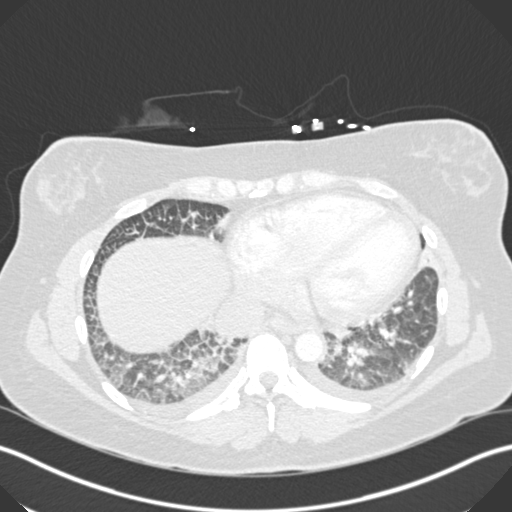
[im 85/254  mediastinal]
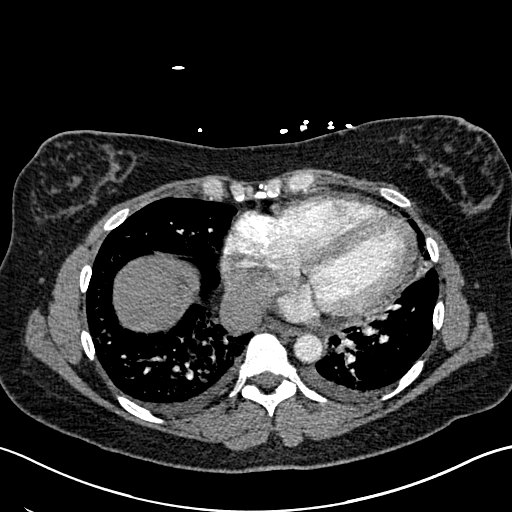
[im 89/254  lung]
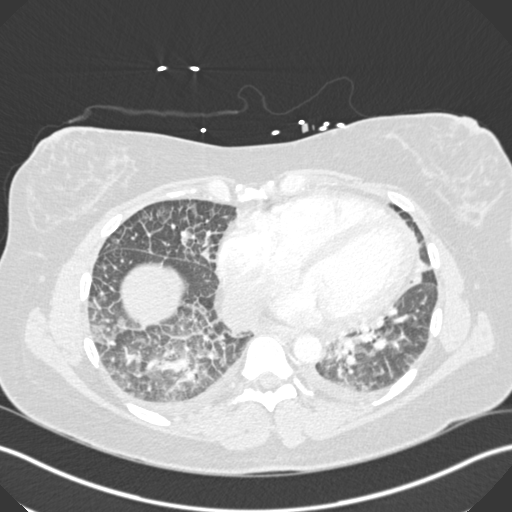
[im 102/254  mediastinal]
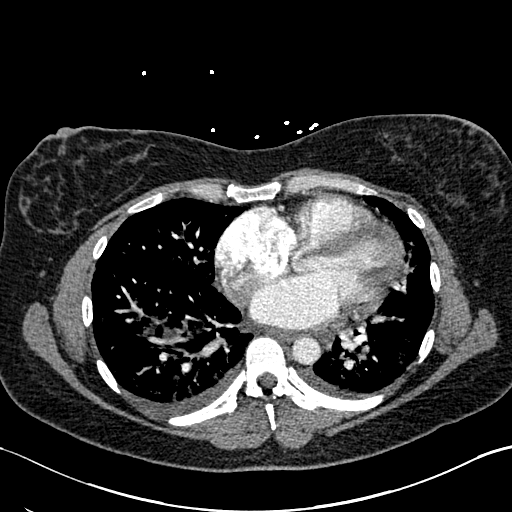
[im 114/254  lung]
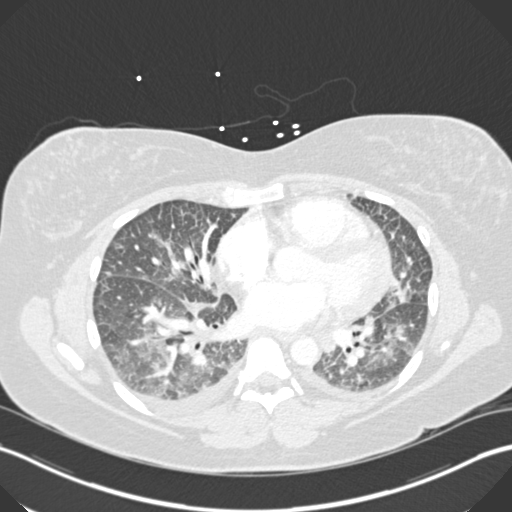
[im 127/254  mediastinal]
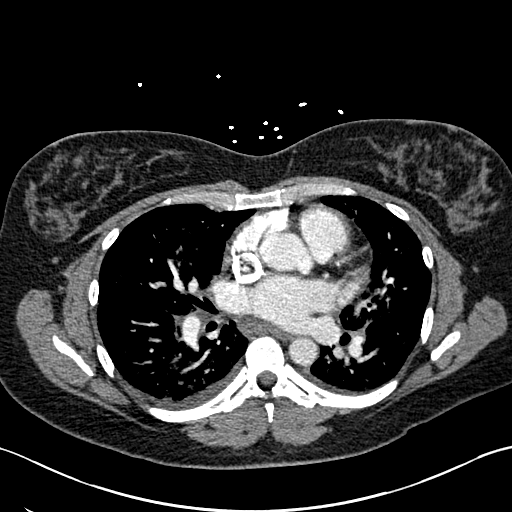
[im 140/254  lung]
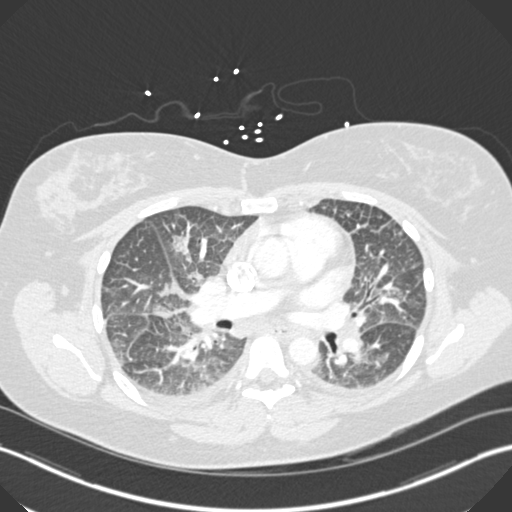
[im 152/254  mediastinal]
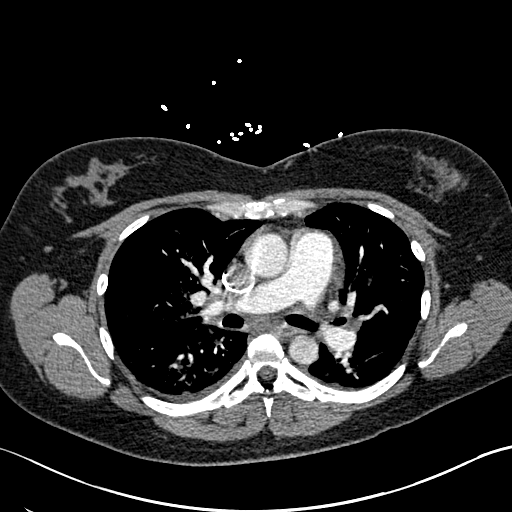
[im 165/254  lung]
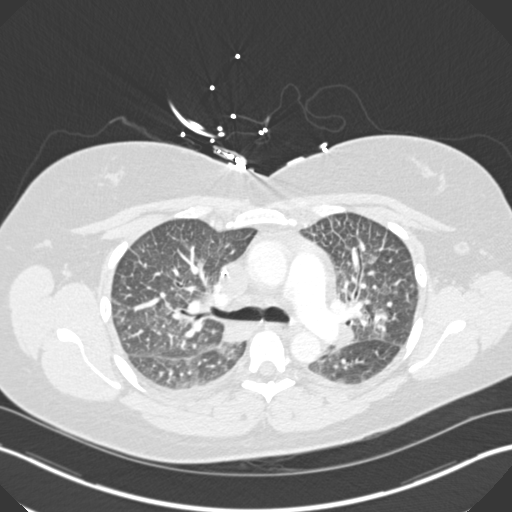
[im 169/254  mediastinal]
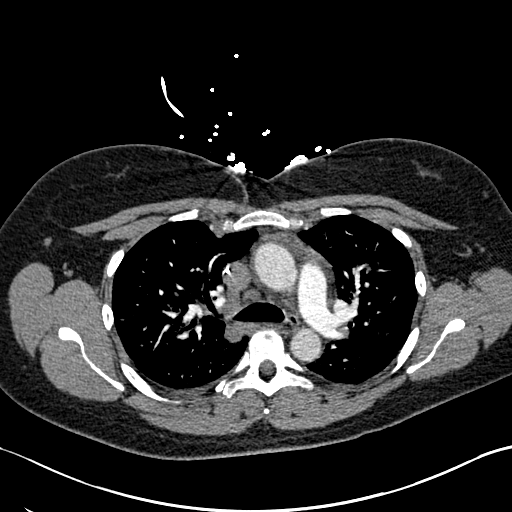
[im 178/254  lung]
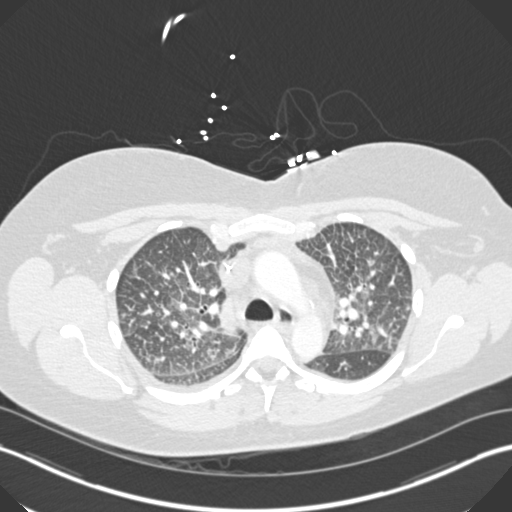
[im 190/254  mediastinal]
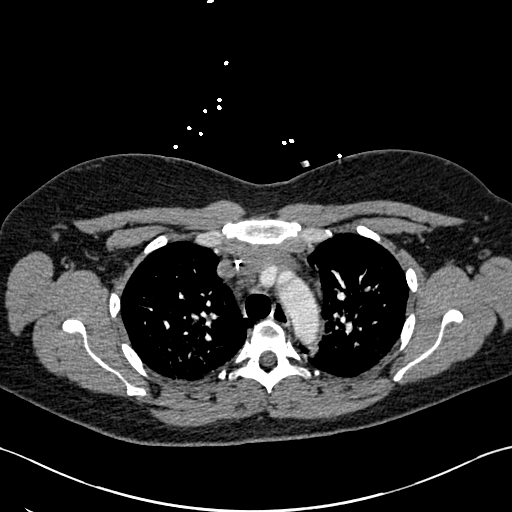
[im 216/254  lung]
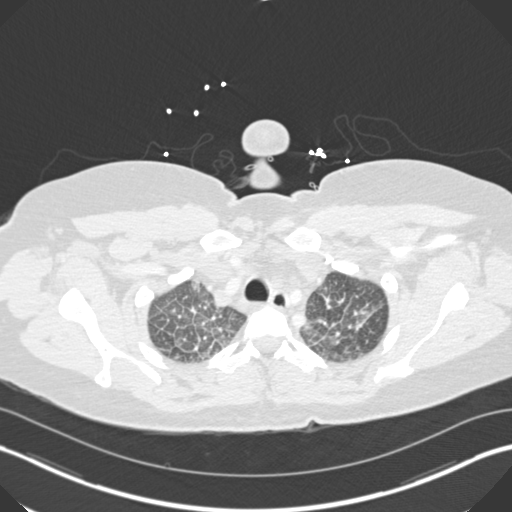
[im 228/254  mediastinal]
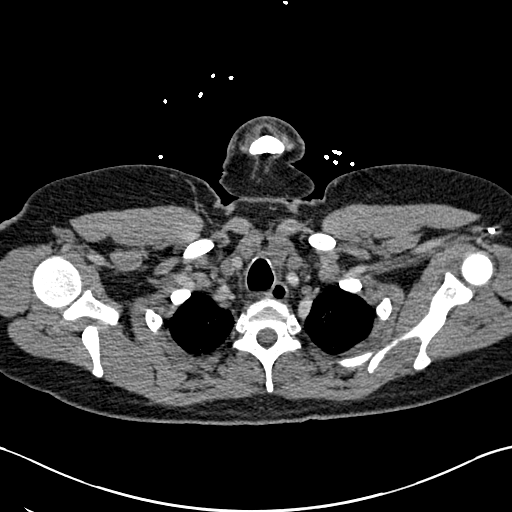
[im 241/254  lung]
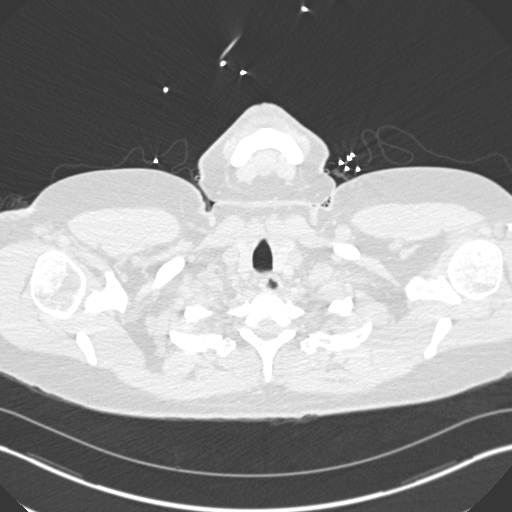

[19 of 32 positions shown; findings below may reference images not displayed]

FINDINGS: Technically adequate study with good opacification of the central
and segmental pulmonary arteries. No focal filling defects
demonstrated. No evidence of significant pulmonary embolus.

Mild cardiac enlargement. Normal caliber thoracic aorta. No aortic
dissection. Left central venous catheter in place. No significant
lymphadenopathy in the chest. Esophagus is mostly decompressed.

Small bilateral pleural effusions. Diffuse interstitial infiltration
and diffuse ground-glass opacities throughout the lungs likely
representing alveolar and interstitial pulmonary edema. Diffuse
pneumonia could also potentially have this appearance. Visualized
portions of the upper abdominal organs are grossly unremarkable.

Review of the MIP images confirms the above findings.
IMPRESSION: No evidence of significant pulmonary embolus. Cardiac enlargement
with small bilateral pleural effusions and diffuse bilateral
airspace and interstitial disease likely representing diffuse
pulmonary edema.

## 2015-03-26 DIAGNOSIS — R443 Hallucinations, unspecified: Secondary | ICD-10-CM

## 2015-03-26 HISTORY — DX: Hallucinations, unspecified: R44.3

## 2015-04-18 ENCOUNTER — Encounter (HOSPITAL_COMMUNITY): Payer: Self-pay | Admitting: Emergency Medicine

## 2015-04-18 ENCOUNTER — Emergency Department (HOSPITAL_COMMUNITY)
Admission: EM | Admit: 2015-04-18 | Discharge: 2015-04-20 | Disposition: A | Discharge status: Home / Self Care | Payer: BC Managed Care – PPO | Attending: Emergency Medicine | Admitting: Emergency Medicine

## 2015-04-18 DIAGNOSIS — Z880 Allergy status to penicillin: Secondary | ICD-10-CM | POA: Insufficient documentation

## 2015-04-18 DIAGNOSIS — Z8619 Personal history of other infectious and parasitic diseases: Secondary | ICD-10-CM | POA: Diagnosis not present

## 2015-04-18 DIAGNOSIS — K219 Gastro-esophageal reflux disease without esophagitis: Secondary | ICD-10-CM | POA: Insufficient documentation

## 2015-04-18 DIAGNOSIS — Z792 Long term (current) use of antibiotics: Secondary | ICD-10-CM | POA: Diagnosis not present

## 2015-04-18 DIAGNOSIS — Z79899 Other long term (current) drug therapy: Secondary | ICD-10-CM | POA: Insufficient documentation

## 2015-04-18 DIAGNOSIS — M797 Fibromyalgia: Secondary | ICD-10-CM | POA: Diagnosis not present

## 2015-04-18 DIAGNOSIS — Z87891 Personal history of nicotine dependence: Secondary | ICD-10-CM | POA: Insufficient documentation

## 2015-04-18 DIAGNOSIS — F29 Unspecified psychosis not due to a substance or known physiological condition: Secondary | ICD-10-CM | POA: Diagnosis present

## 2015-04-18 DIAGNOSIS — R443 Hallucinations, unspecified: Secondary | ICD-10-CM | POA: Diagnosis not present

## 2015-04-18 DIAGNOSIS — M7989 Other specified soft tissue disorders: Secondary | ICD-10-CM | POA: Insufficient documentation

## 2015-04-18 LAB — URINALYSIS, ROUTINE W REFLEX MICROSCOPIC
Bilirubin Urine: NEGATIVE
GLUCOSE, UA: NEGATIVE mg/dL
Ketones, ur: NEGATIVE mg/dL
LEUKOCYTES UA: NEGATIVE
Nitrite: NEGATIVE
Protein, ur: NEGATIVE mg/dL
Specific Gravity, Urine: 1.025 (ref 1.005–1.030)
Urobilinogen, UA: 0.2 mg/dL (ref 0.0–1.0)
pH: 5 (ref 5.0–8.0)

## 2015-04-18 LAB — URINE MICROSCOPIC-ADD ON

## 2015-04-18 LAB — COMPREHENSIVE METABOLIC PANEL
ALBUMIN: 4.4 g/dL (ref 3.5–5.0)
ALT: 22 U/L (ref 14–54)
AST: 25 U/L (ref 15–41)
Alkaline Phosphatase: 88 U/L (ref 38–126)
Anion gap: 8 (ref 5–15)
BUN: 23 mg/dL — AB (ref 6–20)
CALCIUM: 9 mg/dL (ref 8.9–10.3)
CO2: 26 mmol/L (ref 22–32)
Chloride: 105 mmol/L (ref 101–111)
Creatinine, Ser: 1.01 mg/dL — ABNORMAL HIGH (ref 0.44–1.00)
GFR calc Af Amer: >60 mL/min (ref >60)
GFR calc non Af Amer: >60 mL/min (ref >60)
GLUCOSE: 122 mg/dL — AB (ref 65–99)
POTASSIUM: 4.1 mmol/L (ref 3.5–5.1)
Sodium: 139 mmol/L (ref 135–145)
TOTAL PROTEIN: 8 g/dL (ref 6.5–8.1)
Total Bilirubin: 0.7 mg/dL (ref 0.3–1.2)

## 2015-04-18 LAB — CBC
HCT: 40.6 % (ref 36.0–46.0)
Hemoglobin: 13.1 g/dL (ref 12.0–15.0)
MCH: 30 pg (ref 26.0–34.0)
MCHC: 32.3 g/dL (ref 30.0–36.0)
MCV: 93.1 fL (ref 78.0–100.0)
Platelets: 473 10*3/uL — ABNORMAL HIGH (ref 150–400)
RBC: 4.36 MIL/uL (ref 3.87–5.11)
RDW: 13 % (ref 11.5–15.5)
WBC: 10.5 10*3/uL (ref 4.0–10.5)

## 2015-04-18 LAB — ACETAMINOPHEN LEVEL: Acetaminophen (Tylenol), Serum: <10 ug/mL — ABNORMAL LOW (ref 10–30)

## 2015-04-18 LAB — RAPID URINE DRUG SCREEN, HOSP PERFORMED
AMPHETAMINES: NOT DETECTED
Barbiturates: NOT DETECTED
Benzodiazepines: POSITIVE — AB
Cocaine: NOT DETECTED
OPIATES: NOT DETECTED
TETRAHYDROCANNABINOL: NOT DETECTED

## 2015-04-18 LAB — ETHANOL

## 2015-04-18 LAB — SALICYLATE LEVEL: Salicylate Lvl: <4 mg/dL (ref 2.8–30.0)

## 2015-04-18 MED ORDER — NAPROXEN 500 MG PO TABS
500.0000 mg | ORAL_TABLET | Freq: Two times a day (BID) | ORAL | Status: DC | PRN
  Administered 2015-04-18: 500 mg via ORAL
  Filled 2015-04-18: qty 1

## 2015-04-18 MED ORDER — CLONIDINE HCL 0.1 MG PO TABS: 0.1000 mg | ORAL_TABLET | ORAL | Status: DC

## 2015-04-18 MED ORDER — HYDROXYZINE HCL 25 MG PO TABS: 25.0000 mg | ORAL_TABLET | Freq: Four times a day (QID) | ORAL | Status: DC | PRN

## 2015-04-18 MED ORDER — CLONIDINE HCL 0.1 MG PO TABS
0.1000 mg | ORAL_TABLET | Freq: Four times a day (QID) | ORAL | Status: DC
  Administered 2015-04-18 – 2015-04-20 (×5): 0.1 mg via ORAL
  Filled 2015-04-18 (×5): qty 1

## 2015-04-18 MED ORDER — ONDANSETRON HCL 4 MG PO TABS
4.0000 mg | ORAL_TABLET | Freq: Three times a day (TID) | ORAL | Status: DC | PRN
  Administered 2015-04-20: 4 mg via ORAL

## 2015-04-18 MED ORDER — METHOCARBAMOL 500 MG PO TABS: 500.0000 mg | ORAL_TABLET | Freq: Three times a day (TID) | ORAL | Status: DC | PRN

## 2015-04-18 MED ORDER — LOPERAMIDE HCL 2 MG PO CAPS: 2.0000 mg | ORAL_CAPSULE | ORAL | Status: DC | PRN

## 2015-04-18 MED ORDER — ONDANSETRON 4 MG PO TBDP: 4.0000 mg | ORAL_TABLET | Freq: Four times a day (QID) | ORAL | Status: DC | PRN

## 2015-04-18 MED ORDER — CLONIDINE HCL 0.1 MG PO TABS: 0.1000 mg | ORAL_TABLET | Freq: Every day | ORAL | Status: DC

## 2015-04-18 MED ORDER — ACETAMINOPHEN 325 MG PO TABS: 650.0000 mg | ORAL_TABLET | ORAL | Status: DC | PRN

## 2015-04-18 MED ORDER — DICYCLOMINE HCL 20 MG PO TABS
20.0000 mg | ORAL_TABLET | Freq: Four times a day (QID) | ORAL | Status: DC | PRN
  Administered 2015-04-19: 20 mg via ORAL
  Filled 2015-04-18: qty 1

## 2015-04-18 MED ORDER — ZOLPIDEM TARTRATE 5 MG PO TABS: 5.0000 mg | ORAL_TABLET | Freq: Every evening | ORAL | Status: DC | PRN

## 2015-04-18 MED ORDER — LORAZEPAM 1 MG PO TABS
1.0000 mg | ORAL_TABLET | Freq: Three times a day (TID) | ORAL | Status: DC | PRN
  Administered 2015-04-19: 1 mg via ORAL
  Filled 2015-04-18: qty 1

## 2015-04-18 NOTE — ED Notes (Signed)
Report received from [place]. Pt. Sleeping at this time, respiration even and unlabored . mom at bed side.Will continue to monitor for safety via security cameras and Q 15 minute checks.

## 2015-04-18 NOTE — ED Notes (Signed)
Pt is tearful and anxious reporting that she is seeing and feeling snakes on her. 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 Pt has a hematoma on her LLE that she received from a fall 3 weeks ago when she fell on some steps. Pt states that she has not worked in four years since being diagnosed with Lyme disease. Pt denies any other medical hx. She denies si and hi. Pt reports that she has not had a drink in 7 yrs. Pt denies drug use.

## 2015-04-18 NOTE — ED Notes (Signed)
edp currently in with pt

## 2015-04-18 NOTE — BH Assessment (Addendum)
Tele Assessment Note   Miranda [name] is an 49 y.o. female with no history of hallucinations who came in voluntarily with police because she started having visual hallucinations and seeing snakes this morning. Per her partner, pt began screaming and calling her partner her sister's name and was very afraid for both of their lives. Pr her partner, throughout the am, she would calm down somewhat when told she might come in to the hospital, but she gradually started to want to come in, and agreed to call the police. Pt is disoriented to date, month, year, but not person or situation. 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  She denies feeing depressed, SI, HI, or auditory hallucinations.  Pt has a history of some depression and has been seeing the same therapist since 1991, but has had no past suicide attempts and no past admissions. She became tearful during assessment when talking about her therapist, saying, "She would be very surprised I am here, and I would like to see her--can she come visit me?" Recent stressors include the possibility of having to move to [place] since her partner lost her job, and the chronic health problems she has had with Lyme disease. She states she has not had much appetite and can't sleep, but her partner says she sleeps about 16 hrs/day and is restless at night. She sometimes has to use a walker or a cane due to balance issues and leg weakness, but "rarely", per her partner.  She admits to a hx of alcohol problems but has been sober 7 yrs. She denies other SA, and has recently started going to a pain clinic to prevent opioid abuse, and her partner states she has been off suboxone for several days now.  Pt was calm, cooperative and pleasant for assessment. Pt was lying in bed for assessment, making poor eye contact due to being drowsy. She was a poor  historian due to mental status. Her speech and movement was slow, she had an anxious affect, and somewhat anxious/depressed mood and became tearful at times.  There was no evidence during assessment pt was responding to internal stimuli or hallucinations, but she did become distracted due to some loud talking in the hall outside her room.  Per Julieanne Cotton, NP, continue medical clearance and observe overnight and re-evaluate in the am.  Axis I: Psychotic Disorder NOS Axis II: Deferred Axis III:  Past Medical History  Diagnosis Date  . ADHD (attention deficit hyperactivity disorder)   . Depression   . GERD (gastroesophageal reflux disease)   . Alcoholism in recovery   . Fibromyalgia syndrome     Extensive rheum w/u by Dr. Kathi Ludwig ([facility]) 06/2013 --all NORMAL.  Marland Kitchen Abnormal Pap smear of cervix 2012  . Lyme disease    Axis IV: occupational problems Axis V: 21-30 behavior considerably influenced by delusions or hallucinations OR serious impairment in judgment, communication OR inability to function in almost all areas  Past Medical History:  Past Medical History  Diagnosis Date  . ADHD (attention deficit hyperactivity disorder)   . Depression   . GERD (gastroesophageal reflux disease)   . Alcoholism in recovery   . Fibromyalgia syndrome     Extensive rheum w/u by Dr. Kathi Ludwig ([facility]) 06/2013 --all NORMAL.  Marland Kitchen Abnormal Pap smear of cervix 2012  . Lyme disease     Past  Surgical History  Procedure Laterality Date  . Hernia repair      Right inguinal and right femoral hernia (2 yrs apart, both repaired by Dr. Gerrit Friends)  . Hand surgery Left 2004    Cat bite--I&D for infection  . Colposcopy      Family History:  Family History  Problem Relation Age of Onset  . Diabetes Mother   . Hypertension Mother   . Heart disease Mother   . Hypertension Father   . Cancer Father     Prostate and melanoma  . Cancer Maternal Grandmother   . Heart disease Maternal Grandfather   . Heart disease Paternal  Grandmother   . Heart disease Paternal Grandfather     Social History:  reports that she quit smoking about 3 years ago. Her smoking use included Cigarettes. She has a 15 pack-year smoking history. She has never used smokeless tobacco. She reports that she does not drink alcohol or use illicit drugs.  Additional Social History:  Alcohol / Drug Use Pain Medications: denies Prescriptions: denies Over the Counter: denies History of alcohol / drug use?:  (hx of alcohol) Longest period of sobriety (when/how long): 7 yrs Negative Consequences of Use:  (denies)  CIWA: CIWA-Ar BP: 136/83 mmHg Pulse Rate: 112 COWS:    PATIENT STRENGTHS: (choose at least two) Ability for insight Active sense of humor Average or above average intelligence Capable of independent living Communication skills Financial means General fund of knowledge Motivation for treatment/growth Supportive family/friends Work skills  Allergies:  Allergies  Allergen Reactions  . Vancomycin Anxiety    Causes cramps and vomiting  . Amoxicillin     Stomach pain    Home Medications:  (Not in a hospital admission)  OB/GYN Status:  No LMP recorded.  General Assessment Data Location of Assessment: [facility] TTS Assessment: In system Is this a Tele or Face-to-Face Assessment?: Tele Assessment Is this an Initial Assessment or a Re-assessment for this encounter?: Initial Assessment Marital status:  (engaged) Is patient pregnant?: No Pregnancy Status: No Living Arrangements: Spouse/significant other Can pt return to current living arrangement?: Yes Admission Status: Voluntary Is patient capable of signing voluntary admission?: Yes Referral Source: Self/Family/Friend Insurance type: BCBS     Crisis Care Plan Living Arrangements: Spouse/significant other Name of Psychiatrist: none Name of Therapist: Jonetta Osgood  Education Status Is patient currently in school?: No  Risk to self with the past 6  months Suicidal Ideation: No Has patient been a risk to self within the past 6 months prior to admission? : No Suicidal Intent: No Has patient had any suicidal intent within the past 6 months prior to admission? : No Is patient at risk for suicide?: No Suicidal Plan?: No Has patient had any suicidal plan within the past 6 months prior to admission? : No Access to Means: No Previous Attempts/Gestures: No Other Self Harm Risks:  (none known) Triggers for Past Attempts: Unknown Intentional Self Injurious Behavior: None Family Suicide History: No Recent stressful life event(s):  (Lyme disease) Persecutory voices/beliefs?: No Depression: No Depression Symptoms:  (deneis) Substance abuse history and/or treatment for substance abuse?: Yes Suicide prevention information given to non-admitted patients: Not applicable  Risk to Others within the past 6 months Homicidal Ideation: No Does patient have any lifetime risk of violence toward others beyond the six months prior to admission? : No Thoughts of Harm to Others: No Current Homicidal Intent: No Current Homicidal Plan: No Access to Homicidal Means: No History of harm to others?: No  Assessment of Violence: None Noted Does patient have access to weapons?: No Criminal Charges Pending?: No Does patient have a court date: No Is patient on probation?: No  Psychosis Hallucinations: Visual (snakes) Delusions: None noted  Mental Status Report Appearance/Hygiene: Disheveled, In scrubs Eye Contact: Poor Motor Activity: Psychomotor retardation Speech: Slow, Slurred Level of Consciousness: Drowsy Mood: Sad Affect: Blunted Anxiety Level: Moderate Thought Processes: Thought Blocking Judgement: Impaired Orientation: Place, Person Obsessive Compulsive Thoughts/Behaviors: Minimal  Cognitive Functioning Concentration: Decreased Memory: Recent Impaired, Remote Intact IQ: Above Average Insight: Poor Impulse Control: Fair Appetite:  Poor Weight Loss: 0 Weight Gain: 0 Sleep: Decreased Total Hours of Sleep:  (?) Vegetative Symptoms: Decreased grooming  ADLScreening [facility] Assessment Services) Patient's cognitive ability adequate to safely complete daily activities?: Yes Patient able to express need for assistance with ADLs?: Yes  Prior Inpatient Therapy Prior Inpatient Therapy: No  Prior Outpatient Therapy Prior Outpatient Therapy: Yes Prior Therapy Dates: 1991 to present Prior Therapy Facilty/Provider(s):  Jonetta Osgood Reason for Treatment:  (counseling) Does patient have an ACCT team?: No Does patient have Intensive In-House Services?  : No Does patient have [facility] services? : No Does patient have P4CC services?: No  ADL Screening (condition at time of admission) Patient's cognitive ability adequate to safely complete daily activities?: Yes Is the patient deaf or have difficulty hearing?: No Does the patient have difficulty concentrating, remembering, or making decisions?: No Patient able to express need for assistance with ADLs?: Yes  Home Assistive Devices/Equipment Home Assistive Devices/Equipment: Environmental consultant (specify type)    Abuse/Neglect Assessment (Assessment to be complete while patient is alone) Physical Abuse: Denies Verbal Abuse: Denies Sexual Abuse: Denies Exploitation of patient/patient's resources: Denies Self-Neglect: Denies Values / Beliefs Cultural Requests During Hospitalization: None Spiritual Requests During Hospitalization: None   Advance Directives (For Healthcare) Does patient have an advance directive?: No Would patient like information on creating an advanced directive?: No - patient declined information    Additional Information 1:1 In Past 12 Months?: No CIRT Risk: No Elopement Risk: No Does patient have medical clearance?: Yes     Disposition:  Disposition Initial Assessment Completed for this Encounter: Yes Disposition of Patient: Other dispositions Other  disposition(s):  (pending psych disposition)  Yasamin Karel Hines 04/18/2015 4:57 PM

## 2015-04-18 NOTE — ED Notes (Signed)
Pt BIB voluntarily by [employer].  Reports seeing snakes.  Denies any other hallucinations, auditory or otherwise.  Denies SI/HI.  Denies substance abuse.

## 2015-04-18 NOTE — BH Assessment (Signed)
[facility] Assessment Progress Note  Spoke with Langston Masker, [place], took history. ED staff will take machine in pt's room.

## 2015-04-18 NOTE — ED Notes (Signed)
Bed: [facility] Expected date:  Expected time:  Means of arrival:  Comments: [employer]-voluntary

## 2015-04-18 NOTE — ED Provider Notes (Signed)
CSN: [id number]     Arrival date & time 04/18/15  1454 History   First MD Initiated Contact with Patient 04/18/15 1526     Chief Complaint  Patient presents with  . Hallucinations     (Consider location/radiation/quality/duration/timing/severity/associated sxs/prior Treatment) Patient is a 49 y.o. female presenting with mental health disorder. The history is provided by the patient. No language interpreter was used.  Mental Health Problem Presenting symptoms: hallucinations   Patient accompanied by:  Family member Degree of incapacity (severity):  Severe Onset quality:  Sudden Duration:  1 day Timing:  Constant Progression:  Worsening Chronicity:  New Context: not medication   Treatment compliance:  Untreated Relieved by:  Nothing Worsened by:  Nothing tried Ineffective treatments:  None tried Associated symptoms: no abdominal pain   Risk factors: hx of mental illness   Pt has stopped taking all of her medications.  Pt reports she has been seeing snakes in her house today  Past Medical History  Diagnosis Date  . ADHD (attention deficit hyperactivity disorder)   . Depression   . GERD (gastroesophageal reflux disease)   . Alcoholism in recovery   . Fibromyalgia syndrome     Extensive rheum w/u by Dr. Kathi Ludwig (Rheum) 06/2013 --all NORMAL.  Marland Kitchen Abnormal Pap smear of cervix 2012  . Lyme disease    Past Surgical History  Procedure Laterality Date  . Hernia repair      Right inguinal and right femoral hernia (2 yrs apart, both repaired by Dr. Gerrit Friends)  . Hand surgery Left 2004    Cat bite--I&D for infection  . Colposcopy     Family History  Problem Relation Age of Onset  . Diabetes Mother   . Hypertension Mother   . Heart disease Mother   . Hypertension Father   . Cancer Father     Prostate and melanoma  . Cancer Maternal Grandmother   . Heart disease Maternal Grandfather   . Heart disease Paternal Grandmother   . Heart disease Paternal Grandfather    History   Substance Use Topics  . Smoking status: Former Smoker -- 1.00 packs/day for 15 years    Types: Cigarettes    Quit date: 11/01/2011  . Smokeless tobacco: Never Used  . Alcohol Use: No   OB History    No data available     Review of Systems  Gastrointestinal: Negative for abdominal pain.  Psychiatric/Behavioral: Positive for hallucinations.  All other systems reviewed and are negative.     Allergies  Vancomycin and Amoxicillin  Home Medications   Prior to Admission medications   Medication Sig Start Date End Date Taking? Authorizing Provider  cefdinir (OMNICEF) 300 MG capsule Take 600 mg by mouth 2 (two) times daily. Lyme disease    Historical Provider, MD  DULoxetine (CYMBALTA) 30 MG capsule Take 30 mg by mouth 3 (three) times daily.     Historical Provider, MD  ferrous fumarate (HEMOCYTE - 106 MG FE) 325 (106 FE) MG TABS tablet Take 1 tablet (106 mg of iron total) by mouth 2 (two) times daily. 08/30/14   Vassie Loll, MD  gabapentin (NEURONTIN) 300 MG capsule Take 300 mg by mouth 4 (four) times daily. May titratate up as needed. Takes with  capsule for a total of  per dose    Historical Provider, MD  metroNIDAZOLE (FLAGYL) 500 MG tablet Take 500 mg by mouth 2 (two) times daily. Lyme disease    Historical Provider, MD  NUCYNTA 100 MG TABS Take 100 mg  by mouth 4 (four) times daily as needed.  03/02/15   Historical Provider, MD  nystatin (MYCOSTATIN) 100000 UNIT/ML suspension Take 1 mL by mouth 4 (four) times daily as needed (For oral thrush caused by antibiotics).  02/26/15   Historical Provider, MD  Omega-3 Fatty Acids (FISH OIL) 1000 MG CAPS Take 1,000 mg by mouth 2 (two) times daily.    Historical Provider, MD  ondansetron (ZOFRAN) 4 MG tablet Take 1 tablet (4 mg total) by mouth every 8 (eight) hours as needed for nausea or vomiting. 08/30/14   Vassie Loll, MD  OVER THE COUNTER MEDICATION Take 500 mg by mouth 2 (two) times daily. Lactoferrin    Historical Provider, MD   pregabalin (LYRICA) 75 MG capsule Take 75 mg by mouth 3 (three) times daily.    Historical Provider, MD  promethazine (PHENERGAN) 25 MG tablet 1 tablet daily as needed for nausea or vomiting.  02/26/15   Historical Provider, MD  rifabutin (MYCOBUTIN) 150 MG capsule 150 mg 2 (two) times daily. For Lyme Disease 02/27/15   Historical Provider, MD  SUBOXONE 8-2 MG FILM Take 8 mg by mouth 3 (three) times daily. 03/02/15   Historical Provider, MD   BP 136/83 mmHg  Pulse 112  Temp(Src) 98.3 F (36.8 C) (Oral)  Resp 18  SpO2 96% Physical Exam  Constitutional: She is oriented to person, place, and time. She appears well-developed and well-nourished.  HENT:  Head: Normocephalic and atraumatic.  Right Ear: External ear normal.  Left Ear: External ear normal.  Mouth/Throat: Oropharynx is clear and moist.  Eyes: Conjunctivae and EOM are normal.  Neck: Normal range of motion.  Cardiovascular: Normal rate and normal heart sounds.   Pulmonary/Chest: Effort normal and breath sounds normal.  Abdominal: Soft. She exhibits no distension.  Musculoskeletal: Normal range of motion. She exhibits tenderness.  Tender swollen left lower leg.    Neurological: She is alert and oriented to person, place, and time.  Psychiatric: She has a normal mood and affect.  Nursing note and vitals reviewed.   ED Course  Procedures (including critical care time) Labs Review Labs Reviewed  ACETAMINOPHEN LEVEL  CBC  COMPREHENSIVE METABOLIC PANEL  ETHANOL  SALICYLATE LEVEL  URINE RAPID DRUG SCREEN, HOSP PERFORMED   Results for orders placed or performed during the hospital encounter of 04/18/15  Acetaminophen level  Result Value Ref Range   Acetaminophen (Tylenol), Serum <10 (L) 10 - 30 ug/mL  CBC  Result Value Ref Range   WBC 10.5 4.0 - 10.5 K/uL   RBC 4.36 3.87 - 5.11 MIL/uL   Hemoglobin 13.1 12.0 - 15.0 g/dL   HCT 24.4 97.5 - 30.0 %   MCV 93.1 78.0 - 100.0 fL   MCH 30.0 26.0 - 34.0 pg   MCHC 32.3 30.0 - 36.0  g/dL   RDW 51.1 02.1 - 11.7 %   Platelets 473 (H) 150 - 400 K/uL  Comprehensive metabolic panel  Result Value Ref Range   Sodium 139 135 - 145 mmol/L   Potassium 4.1 3.5 - 5.1 mmol/L   Chloride 105 101 - 111 mmol/L   CO2 26 22 - 32 mmol/L   Glucose, Bld 122 (H) 65 - 99 mg/dL   BUN 23 (H) 6 - 20 mg/dL   Creatinine, Ser 3.56 (H) 0.44 - 1.00 mg/dL   Calcium 9.0 8.9 - 70.1 mg/dL   Total Protein 8.0 6.5 - 8.1 g/dL   Albumin 4.4 3.5 - 5.0 g/dL   AST 25 15 -  41 U/L   ALT 22 14 - 54 U/L   Alkaline Phosphatase 88 38 - 126 U/L   Total Bilirubin 0.7 0.3 - 1.2 mg/dL   GFR calc non Af Amer >60 >60 mL/min   GFR calc Af Amer >60 >60 mL/min   Anion gap 8 5 - 15  Ethanol (ETOH)  Result Value Ref Range   Alcohol, Ethyl (B) <5 <5 mg/dL  Salicylate level  Result Value Ref Range   Salicylate Lvl <4.0 2.8 - 30.0 mg/dL  Urine rapid drug screen (hosp performed)not at [facility]  Result Value Ref Range   Opiates NONE DETECTED NONE DETECTED   Cocaine NONE DETECTED NONE DETECTED   Benzodiazepines POSITIVE (A) NONE DETECTED   Amphetamines NONE DETECTED NONE DETECTED   Tetrahydrocannabinol NONE DETECTED NONE DETECTED   Barbiturates NONE DETECTED NONE DETECTED   No results found.   Imaging Review No results found.   EKG Interpretation None       Dr. Adriana Simas in to see and examine.   TTS to consult.  Pt advised to follow up with Orthopaedist.  (Mother wants to see Dr. Thomasena Edis.  I advised to call for appointment next week)   Final diagnoses:  Hallucinations    Pt will be observed and see Psychiatist in am    Elson Areas, PA-C 04/18/15 1901  Donnetta Hutching, MD 04/18/15 2333

## 2015-04-18 NOTE — ED Notes (Signed)
Pt's mother is visiting pt and reports that pt recently stopped her medications to see a Dr. Cory Munch that treats her for Lyme disease in [place]. Pt's mother did not know the names of the medications and reports that they can be obtained from pt's partner. Reported information to NP and received verbal order to place pt on clonidine protocol.

## 2015-04-19 ENCOUNTER — Emergency Department (HOSPITAL_COMMUNITY): Payer: BC Managed Care – PPO

## 2015-04-19 ENCOUNTER — Encounter (HOSPITAL_COMMUNITY): Payer: Self-pay | Admitting: Radiology

## 2015-04-19 DIAGNOSIS — F29 Unspecified psychosis not due to a substance or known physiological condition: Secondary | ICD-10-CM

## 2015-04-19 MED ORDER — RISPERIDONE 0.5 MG PO TBDP
0.5000 mg | ORAL_TABLET | Freq: Two times a day (BID) | ORAL | Status: DC
  Administered 2015-04-19 – 2015-04-20 (×3): 0.5 mg via ORAL
  Filled 2015-04-19 (×4): qty 1

## 2015-04-19 NOTE — ED Notes (Signed)
Pt. noted in room. No complaints or concerns voiced. No distress or abnormal behavior noted. Will continue to monitor with security cameras. Q 15 minute rounds continue. 

## 2015-04-19 NOTE — ED Notes (Signed)
Pt. Noted sleeping in room. No complaints or concerns voiced. No distress or abnormal behavior noted. Will continue to monitor with security cameras. Q 15 minute rounds continue. 

## 2015-04-19 NOTE — ED Notes (Signed)
Pt. Noted in room. No complaints or concerns voiced. No distress or abnormal behavior noted. Will continue to monitor with security cameras. Q 15 minute rounds continue. 

## 2015-04-19 NOTE — Consult Note (Signed)
[facility] Consult   Reason for Consult:  Psychosis Referring Physician:  EDP Patient Identification: Miranda [name] MRN:  [id number] Principal Diagnosis: Psychosis Diagnosis:   Patient Active Problem List   Diagnosis Date Noted  . Psychosis [F29] 04/19/2015    Priority: High  . Esophageal reflux [K21.9]   . Elevated BP [R03.0]   . Acute pulmonary edema [J81.0] 08/28/2014  . Chronic pain syndrome [G89.4] 08/28/2014  . HCAP (healthcare-associated pneumonia) [J18.9] 08/26/2014  . Acute respiratory failure with hypoxia [J96.01] 08/26/2014  . Fever [R50.9] 07/25/2014  . History of Lyme disease [Z86.19] 07/25/2014  . Facet arthropathy, lumbar [M47.816] 08/15/2013  . Adult ADHD [F90.0] 07/03/2013  . Alcoholism in recovery [F10.20] 07/03/2013  . Depression [F32.9] 12/20/2012    Total Time spent with patient: 1 hour  Subjective:   Miranda [name] is a 49 y.o. female patient admitted with Unspecified Psychosis.  HPI: Caucasian female, 49 years old was evaluated for seeing snakes crawling around her neck and necklace.  She reported hearing her fiance telling her that there is a snake crawling on her engagement ring.  Patient reported that she stopped taking her Suboxone and all other narcotic suddenly three days ago.   She took her last Suboxone on Thursday and plans to get off all Narcotics. Patient reports suffering from complications of Lyme disease.  Patient  Reported seeing a doctor at Pain clinic for pain related to Lyme disease.   Patient does not see a psychiatrist but sees Dr Harrington Challenger at [facility] in [place] who placed her on Cymbalta.  Patient denies SI/HI/VH.   Patient also stated that her pain doctor placed her Seroquel about 3 week ago which she stopped taking due to dizziness.  Patient stated that she does not know the reason why she was prescribed Seroquel.  Patient denies any drug or Alcohol use.  Patient was tearful and terrified about seeing snakes.  She  has been accepted for admission and we will be seeking placement at a hospital with inpatient Psychiatric unit.  HPI Elements:   Location:  Psychosis. Quality:  Severe, seeing snakes. Severity:  severe. Timing:  Acute. Duration:  sudden on set. Context:  Seeking treatment for visual halucination seeing snakes..  Past Medical History:  Past Medical History  Diagnosis Date  . ADHD (attention deficit hyperactivity disorder)   . Depression   . GERD (gastroesophageal reflux disease)   . Alcoholism in recovery   . Fibromyalgia syndrome     Extensive rheum w/u by Dr. Dossie Der (Rheum) 06/2013 --all NORMAL.  Marland Kitchen Abnormal Pap smear of cervix 2012  . Lyme disease     Past Surgical History  Procedure Laterality Date  . Hernia repair      Right inguinal and right femoral hernia (2 yrs apart, both repaired by Dr. Harlow Asa)  . Hand surgery Left 2004    Cat bite--I&D for infection  . Colposcopy     Family History:  Family History  Problem Relation Age of Onset  . Diabetes Mother   . Hypertension Mother   . Heart disease Mother   . Hypertension Father   . Cancer Father     Prostate and melanoma  . Cancer Maternal Grandmother   . Heart disease Maternal Grandfather   . Heart disease Paternal Grandmother   . Heart disease Paternal Grandfather    Social History:  History  Alcohol Use No     History  Drug Use No    History   Social  History  . Marital Status: Single    Spouse Name: N/A  . Number of Children: N/A  . Years of Education: N/A   Social History Main Topics  . Smoking status: Former Smoker -- 1.00 packs/day for 15 years    Types: Cigarettes    Quit date: 11/01/2011  . Smokeless tobacco: Never Used  . Alcohol Use: No  . Drug Use: No  . Sexual Activity: No   Other Topics Concern  . None   Social History Narrative   Lives with partner in [place].  Orig from [place].   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   Ed level: Masters Degree   Tob 10 pack yr hx quit 2  yrs ago.   Alcoholism: quit 2009.   Regular exercise: not at this time   Caffeine use: daily   Additional Social History:    Pain Medications: denies Prescriptions: denies Over the Counter: denies History of alcohol / drug use?:  (hx of alcohol) Longest period of sobriety (when/how long): 7 yrs Negative Consequences of Use:  (denies)   Allergies:   Allergies  Allergen Reactions  . Vancomycin Anxiety    Causes cramps and vomiting  . Amoxicillin     Stomach pain    Labs:  Results for orders placed or performed during the hospital encounter of 04/18/15 (from the past 48 hour(s))  Acetaminophen level     Status: Abnormal   Collection Time: 04/18/15  3:57 PM  Result Value Ref Range   Acetaminophen (Tylenol), Serum <10 (L) 10 - 30 ug/mL    Comment:        THERAPEUTIC CONCENTRATIONS VARY SIGNIFICANTLY. A RANGE OF 10-30 ug/mL MAY BE AN EFFECTIVE CONCENTRATION FOR MANY PATIENTS. HOWEVER, SOME ARE BEST TREATED AT CONCENTRATIONS OUTSIDE THIS RANGE. ACETAMINOPHEN CONCENTRATIONS >150 ug/mL AT 4 HOURS AFTER INGESTION AND >50 ug/mL AT 12 HOURS AFTER INGESTION ARE OFTEN ASSOCIATED WITH TOXIC REACTIONS.   CBC     Status: Abnormal   Collection Time: 04/18/15  3:57 PM  Result Value Ref Range   WBC 10.5 4.0 - 10.5 K/uL   RBC 4.36 3.87 - 5.11 MIL/uL   Hemoglobin 13.1 12.0 - 15.0 g/dL   HCT 40.6 36.0 - 46.0 %   MCV 93.1 78.0 - 100.0 fL   MCH 30.0 26.0 - 34.0 pg   MCHC 32.3 30.0 - 36.0 g/dL   RDW 13.0 11.5 - 15.5 %   Platelets 473 (H) 150 - 400 K/uL  Comprehensive metabolic panel     Status: Abnormal   Collection Time: 04/18/15  3:57 PM  Result Value Ref Range   Sodium 139 135 - 145 mmol/L   Potassium 4.1 3.5 - 5.1 mmol/L   Chloride 105 101 - 111 mmol/L   CO2 26 22 - 32 mmol/L   Glucose, Bld 122 (H) 65 - 99 mg/dL   BUN 23 (H) 6 - 20 mg/dL   Creatinine, Ser 1.01 (H) 0.44 - 1.00 mg/dL   Calcium 9.0 8.9 - 10.3 mg/dL   Total Protein 8.0 6.5 - 8.1 g/dL   Albumin 4.4 3.5 - 5.0  g/dL   AST 25 15 - 41 U/L   ALT 22 14 - 54 U/L   Alkaline Phosphatase 88 38 - 126 U/L   Total Bilirubin 0.7 0.3 - 1.2 mg/dL   GFR calc non Af Amer >60 >60 mL/min   GFR calc Af Amer >60 >60 mL/min    Comment: (NOTE) The eGFR has been calculated using the  CKD EPI equation. This calculation has not been validated in all clinical situations. eGFR's persistently <60 mL/min signify possible Chronic Kidney Disease.    Anion gap 8 5 - 15  Ethanol (ETOH)     Status: None   Collection Time: 04/18/15  3:57 PM  Result Value Ref Range   Alcohol, Ethyl (B) <5 <5 mg/dL    Comment:        LOWEST DETECTABLE LIMIT FOR SERUM ALCOHOL IS 5 mg/dL FOR MEDICAL PURPOSES ONLY   Salicylate level     Status: None   Collection Time: 04/18/15  3:57 PM  Result Value Ref Range   Salicylate Lvl <0.4 2.8 - 30.0 mg/dL  Urine rapid drug screen (hosp performed)not at [facility]     Status: Abnormal   Collection Time: 04/18/15  4:19 PM  Result Value Ref Range   Opiates NONE DETECTED NONE DETECTED   Cocaine NONE DETECTED NONE DETECTED   Benzodiazepines POSITIVE (A) NONE DETECTED   Amphetamines NONE DETECTED NONE DETECTED   Tetrahydrocannabinol NONE DETECTED NONE DETECTED   Barbiturates NONE DETECTED NONE DETECTED    Comment:        DRUG SCREEN FOR MEDICAL PURPOSES ONLY.  IF CONFIRMATION IS NEEDED FOR ANY PURPOSE, NOTIFY LAB WITHIN 5 DAYS.        LOWEST DETECTABLE LIMITS FOR URINE DRUG SCREEN Drug Class       Cutoff (ng/mL) Amphetamine      1000 Barbiturate      200 Benzodiazepine   888 Tricyclics       916 Opiates          300 Cocaine          300 THC              50   Urinalysis, Routine w reflex microscopic (not at [facility])     Status: Abnormal   Collection Time: 04/18/15  4:19 PM  Result Value Ref Range   Color, Urine AMBER (A) YELLOW    Comment: BIOCHEMICALS MAY BE AFFECTED BY COLOR   APPearance TURBID (A) CLEAR   Specific Gravity, Urine 1.025 1.005 - 1.030   pH 5.0 5.0 - 8.0   Glucose, UA NEGATIVE  NEGATIVE mg/dL   Hgb urine dipstick LARGE (A) NEGATIVE   Bilirubin Urine NEGATIVE NEGATIVE   Ketones, ur NEGATIVE NEGATIVE mg/dL   Protein, ur NEGATIVE NEGATIVE mg/dL   Urobilinogen, UA 0.2 0.0 - 1.0 mg/dL   Nitrite NEGATIVE NEGATIVE   Leukocytes, UA NEGATIVE NEGATIVE  Urine microscopic-add on     Status: Abnormal   Collection Time: 04/18/15  4:19 PM  Result Value Ref Range   Squamous Epithelial / LPF RARE RARE   RBC / HPF 0-2 <3 RBC/hpf   Bacteria, UA MANY (A) RARE   Crystals URIC ACID CRYSTALS (A) NEGATIVE   Urine-Other AMORPHOUS URATES/PHOSPHATES     Vitals: Blood pressure 149/80, pulse 78, temperature 97.6 F (36.4 C), temperature source Oral, resp. rate 18, last menstrual period 04/18/2015, SpO2 97 %.  Risk to Self: Suicidal Ideation: No Suicidal Intent: No Is patient at risk for suicide?: No Suicidal Plan?: No Access to Means: No Other Self Harm Risks:  (none known) Triggers for Past Attempts: Unknown Intentional Self Injurious Behavior: None Risk to Others: Homicidal Ideation: No Thoughts of Harm to Others: No Current Homicidal Intent: No Current Homicidal Plan: No Access to Homicidal Means: No History of harm to others?: No Assessment of Violence: None Noted Does patient have access to weapons?: No Criminal  Charges Pending?: No Does patient have a court date: No Prior Inpatient Therapy: Prior Inpatient Therapy: No Prior Outpatient Therapy: Prior Outpatient Therapy: Yes Prior Therapy Dates: 1991 to present Prior Therapy Facilty/Provider(s):  [facility] Reason for Treatment:  (counseling) Does patient have an ACCT team?: No Does patient have Intensive In-House Services?  : No Does patient have [facility] services? : No Does patient have P4[facility] services?: No  Current Facility-Administered Medications  Medication Dose Route Frequency Provider Last Rate Last Dose  . acetaminophen (TYLENOL) tablet 650 mg  650 mg Oral Q4H PRN Fransico Meadow, PA-C      . cloNIDine  (CATAPRES) tablet 0.1 mg  0.1 mg Oral QID Delfin Gant, NP   0.1 mg at 04/19/15 1603   Followed by  . [START ON 04/21/2015] cloNIDine (CATAPRES) tablet 0.1 mg  0.1 mg Oral BH-qamhs Delfin Gant, NP       Followed by  . [START ON 04/23/2015] cloNIDine (CATAPRES) tablet 0.1 mg  0.1 mg Oral QAC breakfast Delfin Gant, NP      . dicyclomine (BENTYL) tablet 20 mg  20 mg Oral Q6H PRN Delfin Gant, NP   20 mg at 04/19/15 0341  . hydrOXYzine (ATARAX/VISTARIL) tablet 25 mg  25 mg Oral Q6H PRN Delfin Gant, NP      . loperamide (IMODIUM) capsule 2-4 mg  2-4 mg Oral PRN Delfin Gant, NP      . LORazepam (ATIVAN) tablet 1 mg  1 mg Oral Q8H PRN Fransico Meadow, PA-C   1 mg at 04/19/15 7915  . methocarbamol (ROBAXIN) tablet 500 mg  500 mg Oral Q8H PRN Delfin Gant, NP      . naproxen (NAPROSYN) tablet 500 mg  500 mg Oral BID PRN Delfin Gant, NP   500 mg at 04/18/15 2140  . ondansetron (ZOFRAN) tablet 4 mg  4 mg Oral Q8H PRN Fransico Meadow, PA-C      . ondansetron (ZOFRAN-ODT) disintegrating tablet 4 mg  4 mg Oral Q6H PRN Delfin Gant, NP      . risperiDONE (RISPERDAL M-TABS) disintegrating tablet 0.5 mg  0.5 mg Oral BID Delfin Gant, NP   0.5 mg at 04/19/15 1603  . zolpidem (AMBIEN) tablet 5 mg  5 mg Oral QHS PRN Fransico Meadow, PA-C       Current Outpatient Prescriptions  Medication Sig Dispense Refill  . clonazePAM (KLONOPIN) 1 MG tablet Take 1 mg by mouth daily as needed for anxiety.   2  . DULoxetine (CYMBALTA) 30 MG capsule Take 30 mg by mouth 3 (three) times daily.     . ferrous fumarate (HEMOCYTE - 106 MG FE) 325 (106 FE) MG TABS tablet Take 1 tablet (106 mg of iron total) by mouth 2 (two) times daily. 60 each 1  . gabapentin (NEURONTIN) 300 MG capsule Take 300 mg by mouth 4 (four) times daily. May titratate up as needed. Takes with 635m capsule for a total of 9025mper dose    . gabapentin (NEURONTIN) 600 MG tablet Take 600 mg by mouth 3  (three) times daily. Takes with 30026mor a total of 900m71mr dose    . lamoTRIgine (LAMICTAL) 100 MG tablet Take 100 mg by mouth 3 (three) times daily.  3  . LORazepam (ATIVAN) 2 MG tablet Take 2 mg by mouth 3 (three) times daily as needed.  3  . Multiple Vitamins-Minerals (MULTIVITAMIN ADULT PO) Take 1 tablet by  mouth daily.    . naproxen (NAPROSYN) 500 MG tablet Take 500 mg by mouth every 12 (twelve) hours as needed for mild pain or moderate pain.   4  . NUCYNTA 100 MG TABS Take 100 mg by mouth 4 (four) times daily as needed (pain).   0  . Omega-3 Fatty Acids (FISH OIL) 1000 MG CAPS Take 1,000 mg by mouth 2 (two) times daily.    [place] Bicarbonate (ZEGERID OTC PO) Take 1 tablet by mouth as needed (reflux).    . ondansetron (ZOFRAN) 4 MG tablet Take 1 tablet (4 mg total) by mouth every 8 (eight) hours as needed for nausea or vomiting.    Marland Kitchen OVER THE COUNTER MEDICATION Take 500 mg by mouth 2 (two) times daily. Lactoferrin    . pregabalin (LYRICA) 75 MG capsule Take 75 mg by mouth 3 (three) times daily.    . promethazine (PHENERGAN) 25 MG tablet Take 25 mg by mouth daily as needed for nausea or vomiting.   1  . QUEtiapine (SEROQUEL) 50 MG tablet Take 50 mg by mouth 3 (three) times daily.  6  . rifabutin (MYCOBUTIN) 150 MG capsule 150 mg 2 (two) times daily. For Lyme Disease  3  . SUBOXONE 8-2 MG FILM Take 8 mg by mouth 3 (three) times daily.  0  . traZODone (DESYREL) 100 MG tablet Take 300 mg by mouth at bedtime as needed for sleep.   6  . cefdinir (OMNICEF) 300 MG capsule Take 600 mg by mouth 2 (two) times daily. Lyme disease    . metroNIDAZOLE (FLAGYL) 500 MG tablet Take 500 mg by mouth 2 (two) times daily. Lyme disease    . nystatin (MYCOSTATIN) 100000 UNIT/ML suspension Take 1 mL by mouth 4 (four) times daily as needed (For oral thrush caused by antibiotics).   3    Musculoskeletal: Strength & Muscle Tone: within normal limits Gait & Station: normal Patient leans:  N/A  Psychiatric Specialty Exam: Physical Exam  Review of Systems  Constitutional: Negative.   HENT: Negative.   Eyes: Negative.   Respiratory: Negative.   Cardiovascular:       Hx of htn  Gastrointestinal: Negative.   Genitourinary: Negative.   Musculoskeletal: Negative.   Skin: Negative.   Neurological: Negative.   Endo/Heme/Allergies: Negative.     Blood pressure 149/80, pulse 78, temperature 97.6 F (36.4 C), temperature source Oral, resp. rate 18, last menstrual period 04/18/2015, SpO2 97 %.There is no weight on file to calculate BMI.  General Appearance: Casual and Disheveled  Eye Contact::  Good  Speech:  Clear and Coherent and Normal Rate  Volume:  Normal  Mood:  Anxious and Depressed  Affect:  Congruent and Tearful  Thought Process:  Coherent  Orientation:  Full (Time, Place, and Person)  Thought Content:  Hallucinations: Auditory Visual  Suicidal Thoughts:  No  Homicidal Thoughts:  No  Memory:  Immediate;   Good Recent;   Good Remote;   Good  Judgement:  Fair  Insight:  Shallow  Psychomotor Activity:  Normal  Concentration:  Fair  Recall:  NA  Fund of Knowledge:Fair  Language: Good  Akathisia:  NA  Handed:  Right  AIMS (if indicated):     Assets:  Desire for Improvement  ADL's:  Impaired  Cognition: WNL  Sleep:      Medical Decision Making: Review of Psycho-Social Stressors (1), Established Problem, Worsening (2), Review of Medication Regimen & Side Effects (2) and Review of New Medication or  Change in Dosage (2)  Treatment Plan Summary: Daily contact with patient to assess and evaluate symptoms and progress in treatment and Medication management  Plan:   Start Risperdal 0.5 mg po bid for mood control Start Ativan 1 mg po every 8 hours as needed for anxiety Initiate Clonidine protocol for Opiate detox. Obtain CT Scan of her head to rule out medical cause of her Psychosis.  Disposition: Admit and seek placement  Delfin Gant   PMHNP-BC   04/19/2015 4:14 PM  Patient seen face-to-face for this evaluation and case discussed with physician extender and made appropriate treatment plan. Reviewed the information documented and agree with the treatment plan.Lisette Grinder R. 04/20/2015 2:06 PM

## 2015-04-19 NOTE — Progress Notes (Signed)
Disposition CSW completed referrals for patient to the following inpatient psych facilities:  Jonna Munro  CSW will continue to assist with patient's placement needs.  [facility] Disposition CSW [phone]

## 2015-04-19 NOTE — ED Notes (Signed)
Report received from [facility]. Pt. With visitor. Will continue to monitor for safety via security cameras and Q 15 minute checks.

## 2015-04-20 DIAGNOSIS — R443 Hallucinations, unspecified: Secondary | ICD-10-CM | POA: Insufficient documentation

## 2015-04-20 MED ORDER — RISPERIDONE 0.5 MG PO TBDP: 0.5000 mg | ORAL_TABLET | Freq: Two times a day (BID) | ORAL | Status: AC

## 2015-04-20 MED ORDER — TRAZODONE HCL 100 MG PO TABS: 100.0000 mg | ORAL_TABLET | Freq: Every evening | ORAL | Status: AC | PRN

## 2015-04-20 NOTE — ED Notes (Signed)
Pt. Noted sleeping in room. No complaints or concerns voiced. No distress or abnormal behavior noted. Will continue to monitor with security cameras. Q 15 minute rounds continue. 

## 2015-04-20 NOTE — [facility] Suicide Risk Assessment (Cosign Needed)
Suicide Risk Assessment  Discharge Assessment   [facility] Discharge Suicide Risk Assessment   Demographic Factors:  Caucasian, Gay, lesbian, or bisexual orientation and Unemployed  Total Time spent with patient: 20 minutes  Musculoskeletal: Strength & Muscle Tone: within normal limits Gait & Station: normal Patient leans: N/A  Psychiatric Specialty Exam:     Blood pressure 113/72, pulse 85, temperature 98 F (36.7 C), temperature source Oral, resp. rate 16, last menstrual period 04/18/2015, SpO2 100 %.There is no weight on file to calculate BMI.  General Appearance: Casual  Eye Contact::  Good  Speech:  Clear and Coherent and Normal Rate  Volume:  Normal  Mood:  Anxious  Affect:  Congruent and Tearful  Thought Process:  Coherent  Orientation:  Full (Time, Place, and Person)  Thought Content:  WDL  Suicidal Thoughts:  No  Homicidal Thoughts:  No  Memory:  Immediate;   Good Recent;   Good Remote;   Good  Judgement:  Fair  Insight:  Good  Psychomotor Activity:  Normal  Concentration:  Fair  Recall:  NA  Fund of Knowledge:Fair  Language: Good  Akathisia:  NA  Handed:  Right  AIMS (if indicated):     Assets:  Desire for Improvement  ADL's:  Intact  Cognition: WNL        Has this patient used any form of tobacco in the last 30 days? (Cigarettes, Smokeless Tobacco, Cigars, and/or Pipes) N/A  Mental Status Per Nursing Assessment::   On Admission:     Current Mental Status by Physician: NA  Loss Factors: NA  Historical Factors: NA  Risk Reduction Factors:   Religious beliefs about death, Living with another person, especially a relative, Positive social support and Positive therapeutic relationship  Continued Clinical Symptoms:  Depression:   Insomnia Alcohol/Substance Abuse/Dependencies  Cognitive Features That Contribute To Risk:  Polarized thinking    Suicide Risk:  Minimal: No identifiable suicidal ideation.  Patients presenting with no risk factors but  with morbid ruminations; may be classified as minimal risk based on the severity of the depressive symptoms  Principal Problem: Psychosis Discharge Diagnoses:  Patient Active Problem List   Diagnosis Date Noted  . Psychosis [F29] 04/19/2015    Priority: High  . Hallucinations [R44.3]   . Esophageal reflux [K21.9]   . Elevated BP [R03.0]   . Acute pulmonary edema [J81.0] 08/28/2014  . Chronic pain syndrome [G89.4] 08/28/2014  . HCAP (healthcare-associated pneumonia) [J18.9] 08/26/2014  . Acute respiratory failure with hypoxia [J96.01] 08/26/2014  . Fever [R50.9] 07/25/2014  . History of Lyme disease [Z86.19] 07/25/2014  . Facet arthropathy, lumbar [M47.816] 08/15/2013  . Adult ADHD [F90.0] 07/03/2013  . Alcoholism in recovery [F10.20] 07/03/2013  . Depression [F32.9] 12/20/2012      Plan Of Care/Follow-up recommendations:  Activity:  As tolerated Diet:  Regular  Is patient on multiple antipsychotic therapies at discharge:  No   Has Patient had three or more failed trials of antipsychotic monotherapy by history:  No  Recommended Plan for Multiple Antipsychotic Therapies: NA    Azel Gumina C   PMHNP-BC 04/20/2015, 1:33 PM

## 2015-04-20 NOTE — Consult Note (Signed)
[facility] Consult   Reason for Consult:  Psychosis Referring Physician:  EDP Patient Identification: Miranda [name] MRN:  [id number] Principal Diagnosis: Psychosis Diagnosis:   Patient Active Problem List   Diagnosis Date Noted  . Psychosis [F29] 04/19/2015    Priority: High  . Esophageal reflux [K21.9]   . Elevated BP [R03.0]   . Acute pulmonary edema [J81.0] 08/28/2014  . Chronic pain syndrome [G89.4] 08/28/2014  . HCAP (healthcare-associated pneumonia) [J18.9] 08/26/2014  . Acute respiratory failure with hypoxia [J96.01] 08/26/2014  . Fever [R50.9] 07/25/2014  . History of Lyme disease [Z86.19] 07/25/2014  . Facet arthropathy, lumbar [M47.816] 08/15/2013  . Adult ADHD [F90.0] 07/03/2013  . Alcoholism in recovery [F10.20] 07/03/2013  . Depression [F32.9] 12/20/2012    Total Time spent with patient: 1 hour  Subjective:   Miranda [name] is a 49 y.o. female patient admitted with Unspecified Psychosis.  HPI: Caucasian female, 49 years old was evaluated for seeing snakes crawling around her neck and necklace.  She reported hearing her fiance telling her that there is a snake crawling on her engagement ring.  Patient reported that she stopped taking her Suboxone and all other narcotic suddenly three days ago.   She took her last Suboxone on Thursday and plans to get off all Narcotics. Patient reports suffering from complications of Lyme disease.  Patient  Reported seeing a doctor at Pain clinic for pain related to Lyme disease.   Patient does not see a psychiatrist but sees Dr Harrington Challenger at [facility] in [place] who placed her on Cymbalta.  Patient denies SI/HI/VH.   Patient also stated that her pain doctor placed her Seroquel about 3 week ago which she stopped taking due to dizziness.  Patient stated that she does not know the reason why she was prescribed Seroquel.  Patient denies any drug or Alcohol use.  Patient was tearful and terrified about seeing snakes.  She  has been accepted for admission and we will be seeking placement at a hospital with inpatient Psychiatric unit.  Patient reports calmer mood, denied auditory hallucination. Patient reports good sleep last night and is happy with Risperdal and promises to take the medication at home.  Patient denies SI/HI/AVH.  Patient is discharged home to follow up with an outpatient Psychiatrist.  Information given.  HPI Elements:   Location:  Psychosis. Quality:  Severe, seeing snakes. Severity:  severe. Timing:  Acute. Duration:  sudden on set. Context:  Seeking treatment for visual halucination seeing snakes..  Past Medical History:  Past Medical History  Diagnosis Date  . ADHD (attention deficit hyperactivity disorder)   . Depression   . GERD (gastroesophageal reflux disease)   . Alcoholism in recovery   . Fibromyalgia syndrome     Extensive rheum w/u by Dr. Dossie Der (Rheum) 06/2013 --all NORMAL.  Marland Kitchen Abnormal Pap smear of cervix 2012  . Lyme disease     Past Surgical History  Procedure Laterality Date  . Hernia repair      Right inguinal and right femoral hernia (2 yrs apart, both repaired by Dr. Harlow Asa)  . Hand surgery Left 2004    Cat bite--I&D for infection  . Colposcopy     Family History:  Family History  Problem Relation Age of Onset  . Diabetes Mother   . Hypertension Mother   . Heart disease Mother   . Hypertension Father   . Cancer Father     Prostate and melanoma  . Cancer Maternal Grandmother   .  Heart disease Maternal Grandfather   . Heart disease Paternal Grandmother   . Heart disease Paternal Grandfather    Social History:  History  Alcohol Use No     History  Drug Use No    History   Social History  . Marital Status: Single    Spouse Name: N/A  . Number of Children: N/A  . Years of Education: N/A   Social History Main Topics  . Smoking status: Former Smoker -- 1.00 packs/day for 15 years    Types: Cigarettes    Quit date: 11/01/2011  . Smokeless tobacco:  Never Used  . Alcohol Use: No  . Drug Use: No  . Sexual Activity: No   Other Topics Concern  . None   Social History Narrative   Lives with partner in [place].  Orig from [place].   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   Ed level: Masters Degree   Tob 10 pack yr hx quit 2 yrs ago.   Alcoholism: quit 2009.   Regular exercise: not at this time   Caffeine use: daily   Additional Social History:    Pain Medications: denies Prescriptions: denies Over the Counter: denies History of alcohol / drug use?:  (hx of alcohol) Longest period of sobriety (when/how long): 7 yrs Negative Consequences of Use:  (denies)   Allergies:   Allergies  Allergen Reactions  . Vancomycin Anxiety    Causes cramps and vomiting  . Amoxicillin     Stomach pain    Labs:  Results for orders placed or performed during the hospital encounter of 04/18/15 (from the past 48 hour(s))  Acetaminophen level     Status: Abnormal   Collection Time: 04/18/15  3:57 PM  Result Value Ref Range   Acetaminophen (Tylenol), Serum <10 (L) 10 - 30 ug/mL    Comment:        THERAPEUTIC CONCENTRATIONS VARY SIGNIFICANTLY. A RANGE OF 10-30 ug/mL MAY BE AN EFFECTIVE CONCENTRATION FOR MANY PATIENTS. HOWEVER, SOME ARE BEST TREATED AT CONCENTRATIONS OUTSIDE THIS RANGE. ACETAMINOPHEN CONCENTRATIONS >150 ug/mL AT 4 HOURS AFTER INGESTION AND >50 ug/mL AT 12 HOURS AFTER INGESTION ARE OFTEN ASSOCIATED WITH TOXIC REACTIONS.   CBC     Status: Abnormal   Collection Time: 04/18/15  3:57 PM  Result Value Ref Range   WBC 10.5 4.0 - 10.5 K/uL   RBC 4.36 3.87 - 5.11 MIL/uL   Hemoglobin 13.1 12.0 - 15.0 g/dL   HCT 40.6 36.0 - 46.0 %   MCV 93.1 78.0 - 100.0 fL   MCH 30.0 26.0 - 34.0 pg   MCHC 32.3 30.0 - 36.0 g/dL   RDW 13.0 11.5 - 15.5 %   Platelets 473 (H) 150 - 400 K/uL  Comprehensive metabolic panel     Status: Abnormal   Collection Time: 04/18/15  3:57 PM  Result Value Ref Range   Sodium 139 135 - 145 mmol/L    Potassium 4.1 3.5 - 5.1 mmol/L   Chloride 105 101 - 111 mmol/L   CO2 26 22 - 32 mmol/L   Glucose, Bld 122 (H) 65 - 99 mg/dL   BUN 23 (H) 6 - 20 mg/dL   Creatinine, Ser 1.01 (H) 0.44 - 1.00 mg/dL   Calcium 9.0 8.9 - 10.3 mg/dL   Total Protein 8.0 6.5 - 8.1 g/dL   Albumin 4.4 3.5 - 5.0 g/dL   AST 25 15 - 41 U/L   ALT 22 14 - 54 U/L   Alkaline Phosphatase  88 38 - 126 U/L   Total Bilirubin 0.7 0.3 - 1.2 mg/dL   GFR calc non Af Amer >60 >60 mL/min   GFR calc Af Amer >60 >60 mL/min    Comment: (NOTE) The eGFR has been calculated using the CKD EPI equation. This calculation has not been validated in all clinical situations. eGFR's persistently <60 mL/min signify possible Chronic Kidney Disease.    Anion gap 8 5 - 15  Ethanol (ETOH)     Status: None   Collection Time: 04/18/15  3:57 PM  Result Value Ref Range   Alcohol, Ethyl (B) <5 <5 mg/dL    Comment:        LOWEST DETECTABLE LIMIT FOR SERUM ALCOHOL IS 5 mg/dL FOR MEDICAL PURPOSES ONLY   Salicylate level     Status: None   Collection Time: 04/18/15  3:57 PM  Result Value Ref Range   Salicylate Lvl <4.0 2.8 - 30.0 mg/dL  Urine rapid drug screen (hosp performed)not at [facility]     Status: Abnormal   Collection Time: 04/18/15  4:19 PM  Result Value Ref Range   Opiates NONE DETECTED NONE DETECTED   Cocaine NONE DETECTED NONE DETECTED   Benzodiazepines POSITIVE (A) NONE DETECTED   Amphetamines NONE DETECTED NONE DETECTED   Tetrahydrocannabinol NONE DETECTED NONE DETECTED   Barbiturates NONE DETECTED NONE DETECTED    Comment:        DRUG SCREEN FOR MEDICAL PURPOSES ONLY.  IF CONFIRMATION IS NEEDED FOR ANY PURPOSE, NOTIFY LAB WITHIN 5 DAYS.        LOWEST DETECTABLE LIMITS FOR URINE DRUG SCREEN Drug Class       Cutoff (ng/mL) Amphetamine      1000 Barbiturate      200 Benzodiazepine   981 Tricyclics       191 Opiates          300 Cocaine          300 THC              50   Urinalysis, Routine w reflex microscopic (not at  [facility])     Status: Abnormal   Collection Time: 04/18/15  4:19 PM  Result Value Ref Range   Color, Urine AMBER (A) YELLOW    Comment: BIOCHEMICALS MAY BE AFFECTED BY COLOR   APPearance TURBID (A) CLEAR   Specific Gravity, Urine 1.025 1.005 - 1.030   pH 5.0 5.0 - 8.0   Glucose, UA NEGATIVE NEGATIVE mg/dL   Hgb urine dipstick LARGE (A) NEGATIVE   Bilirubin Urine NEGATIVE NEGATIVE   Ketones, ur NEGATIVE NEGATIVE mg/dL   Protein, ur NEGATIVE NEGATIVE mg/dL   Urobilinogen, UA 0.2 0.0 - 1.0 mg/dL   Nitrite NEGATIVE NEGATIVE   Leukocytes, UA NEGATIVE NEGATIVE  Urine microscopic-add on     Status: Abnormal   Collection Time: 04/18/15  4:19 PM  Result Value Ref Range   Squamous Epithelial / LPF RARE RARE   RBC / HPF 0-2 <3 RBC/hpf   Bacteria, UA MANY (A) RARE   Crystals URIC ACID CRYSTALS (A) NEGATIVE   Urine-Other AMORPHOUS URATES/PHOSPHATES     Vitals: Blood pressure 113/72, pulse 85, temperature 98 F (36.7 C), temperature source Oral, resp. rate 16, last menstrual period 04/18/2015, SpO2 100 %.  Risk to Self: Suicidal Ideation: No Suicidal Intent: No Is patient at risk for suicide?: No Suicidal Plan?: No Access to Means: No Other Self Harm Risks:  (none known) Triggers for Past Attempts: Unknown Intentional Self Injurious Behavior:  None Risk to Others: Homicidal Ideation: No Thoughts of Harm to Others: No Current Homicidal Intent: No Current Homicidal Plan: No Access to Homicidal Means: No History of harm to others?: No Assessment of Violence: None Noted Does patient have access to weapons?: No Criminal Charges Pending?: No Does patient have a court date: No Prior Inpatient Therapy: Prior Inpatient Therapy: No Prior Outpatient Therapy: Prior Outpatient Therapy: Yes Prior Therapy Dates: 1991 to present Prior Therapy Facilty/Provider(s):  [facility] Reason for Treatment:  (counseling) Does patient have an ACCT team?: No Does patient have Intensive In-House Services?  :  No Does patient have [facility] services? : No Does patient have P4CC services?: No  Current Facility-Administered Medications  Medication Dose Route Frequency Provider Last Rate Last Dose  . acetaminophen (TYLENOL) tablet 650 mg  650 mg Oral Q4H PRN Fransico Meadow, PA-C      . cloNIDine (CATAPRES) tablet 0.1 mg  0.1 mg Oral QID Delfin Gant, NP   Stopped at 04/20/15 1304   Followed by  . [START ON 04/21/2015] cloNIDine (CATAPRES) tablet 0.1 mg  0.1 mg Oral BH-qamhs Delfin Gant, NP       Followed by  . [START ON 04/23/2015] cloNIDine (CATAPRES) tablet 0.1 mg  0.1 mg Oral QAC breakfast Delfin Gant, NP      . dicyclomine (BENTYL) tablet 20 mg  20 mg Oral Q6H PRN Delfin Gant, NP   20 mg at 04/19/15 0341  . hydrOXYzine (ATARAX/VISTARIL) tablet 25 mg  25 mg Oral Q6H PRN Delfin Gant, NP      . loperamide (IMODIUM) capsule 2-4 mg  2-4 mg Oral PRN Delfin Gant, NP      . LORazepam (ATIVAN) tablet 1 mg  1 mg Oral Q8H PRN Fransico Meadow, PA-C   1 mg at 04/19/15 4680  . methocarbamol (ROBAXIN) tablet 500 mg  500 mg Oral Q8H PRN Delfin Gant, NP      . naproxen (NAPROSYN) tablet 500 mg  500 mg Oral BID PRN Delfin Gant, NP   500 mg at 04/18/15 2140  . ondansetron (ZOFRAN) tablet 4 mg  4 mg Oral Q8H PRN Fransico Meadow, PA-C   4 mg at 04/20/15 1048  . ondansetron (ZOFRAN-ODT) disintegrating tablet 4 mg  4 mg Oral Q6H PRN Delfin Gant, NP      . risperiDONE (RISPERDAL M-TABS) disintegrating tablet 0.5 mg  0.5 mg Oral BID Delfin Gant, NP   0.5 mg at 04/20/15 1008  . zolpidem (AMBIEN) tablet 5 mg  5 mg Oral QHS PRN Fransico Meadow, PA-C       Current Outpatient Prescriptions  Medication Sig Dispense Refill  . clonazePAM (KLONOPIN) 1 MG tablet Take 1 mg by mouth daily as needed for anxiety.   2  . DULoxetine (CYMBALTA) 30 MG capsule Take 30 mg by mouth 3 (three) times daily.     . ferrous fumarate (HEMOCYTE - 106 MG FE) 325 (106 FE) MG TABS tablet  Take 1 tablet (106 mg of iron total) by mouth 2 (two) times daily. 60 each 1  . gabapentin (NEURONTIN) 300 MG capsule Take 300 mg by mouth 4 (four) times daily. May titratate up as needed. Takes with 63m capsule for a total of 9068mper dose    . gabapentin (NEURONTIN) 600 MG tablet Take 600 mg by mouth 3 (three) times daily. Takes with 30084mor a total of 900m63mr dose    .  lamoTRIgine (LAMICTAL) 100 MG tablet Take 100 mg by mouth 3 (three) times daily.  3  . LORazepam (ATIVAN) 2 MG tablet Take 2 mg by mouth 3 (three) times daily as needed.  3  . Multiple Vitamins-Minerals (MULTIVITAMIN ADULT PO) Take 1 tablet by mouth daily.    . naproxen (NAPROSYN) 500 MG tablet Take 500 mg by mouth every 12 (twelve) hours as needed for mild pain or moderate pain.   4  . NUCYNTA 100 MG TABS Take 100 mg by mouth 4 (four) times daily as needed (pain).   0  . Omega-3 Fatty Acids (FISH OIL) 1000 MG CAPS Take 1,000 mg by mouth 2 (two) times daily.    Earney Navy Bicarbonate (ZEGERID OTC PO) Take 1 tablet by mouth as needed (reflux).    . ondansetron (ZOFRAN) 4 MG tablet Take 1 tablet (4 mg total) by mouth every 8 (eight) hours as needed for nausea or vomiting.    Marland Kitchen OVER THE COUNTER MEDICATION Take 500 mg by mouth 2 (two) times daily. Lactoferrin    . pregabalin (LYRICA) 75 MG capsule Take 75 mg by mouth 3 (three) times daily.    . promethazine (PHENERGAN) 25 MG tablet Take 25 mg by mouth daily as needed for nausea or vomiting.   1  . QUEtiapine (SEROQUEL) 50 MG tablet Take 50 mg by mouth 3 (three) times daily.  6  . rifabutin (MYCOBUTIN) 150 MG capsule 150 mg 2 (two) times daily. For Lyme Disease  3  . SUBOXONE 8-2 MG FILM Take 8 mg by mouth 3 (three) times daily.  0  . cefdinir (OMNICEF) 300 MG capsule Take 600 mg by mouth 2 (two) times daily. Lyme disease    . metroNIDAZOLE (FLAGYL) 500 MG tablet Take 500 mg by mouth 2 (two) times daily. Lyme disease    . nystatin (MYCOSTATIN) 100000 UNIT/ML suspension  Take 1 mL by mouth 4 (four) times daily as needed (For oral thrush caused by antibiotics).   3  . risperiDONE (RISPERDAL M-TABS) 0.5 MG disintegrating tablet Take 1 tablet (0.5 mg total) by mouth 2 (two) times daily. 60 tablet 0  . traZODone (DESYREL) 100 MG tablet Take 1 tablet (100 mg total) by mouth at bedtime as needed for sleep. 30 tablet 0    Musculoskeletal: Strength & Muscle Tone: within normal limits Gait & Station: normal Patient leans: N/A  Psychiatric Specialty Exam: Physical Exam  Review of Systems  Constitutional: Negative.   HENT: Negative.   Eyes: Negative.   Respiratory: Negative.   Cardiovascular:       Hx of htn  Gastrointestinal: Negative.   Genitourinary: Negative.   Musculoskeletal: Negative.   Skin: Negative.   Neurological: Negative.   Endo/Heme/Allergies: Negative.     Blood pressure 113/72, pulse 85, temperature 98 F (36.7 C), temperature source Oral, resp. rate 16, last menstrual period 04/18/2015, SpO2 100 %.There is no weight on file to calculate BMI.  General Appearance: Casual  Eye Contact::  Good  Speech:  Clear and Coherent and Normal Rate  Volume:  Normal  Mood:  Anxious  Affect:  Congruent and Tearful  Thought Process:  Coherent  Orientation:  Full (Time, Place, and Person)  Thought Content:  WDL  Suicidal Thoughts:  No  Homicidal Thoughts:  No  Memory:  Immediate;   Good Recent;   Good Remote;   Good  Judgement:  Fair  Insight:  Good  Psychomotor Activity:  Normal  Concentration:  Fair  Recall:  NA  Fund of Knowledge:Fair  Language: Good  Akathisia:  NA  Handed:  Right  AIMS (if indicated):     Assets:  Desire for Improvement  ADL's:  Intact  Cognition: WNL  Sleep:      Medical Decision Making: Established Problem, Stable/Improving (1)   Disposition:  Discharge home, follow up with an outpatient  Psychiatrist and counselor.  Follow up with your PMD  Delfin Gant   PMHNP-BC  04/20/2015 1:24 PM  Patient seen  face-to-face for this psychiatric evaluation and case discussed with physician extender, treatment team and made appropriate disposition treatment plan. Reviewed the information documented and agree with the treatment plan.  Karra Pink,JANARDHAHA R. 04/20/2015 2:15 PM

## 2015-04-20 NOTE — Progress Notes (Signed)
Counselor provided patient with outpatient information for psychiatrists that accept [employer]. Patient thanked Counselor and verbalized no additional concerns.    Miranda [name]. MSW, LCSW Therapeutic Triage Services-Triage Specialist   Phone: [phone]

## 2015-04-25 ENCOUNTER — Emergency Department (HOSPITAL_COMMUNITY): Payer: BC Managed Care – PPO

## 2015-04-25 ENCOUNTER — Emergency Department (HOSPITAL_COMMUNITY)
Admission: EM | Admit: 2015-04-25 | Discharge: 2015-04-25 | Disposition: A | Discharge status: Home / Self Care | Payer: BC Managed Care – PPO | Attending: Emergency Medicine | Admitting: Emergency Medicine

## 2015-04-25 ENCOUNTER — Encounter (HOSPITAL_COMMUNITY): Payer: Self-pay

## 2015-04-25 DIAGNOSIS — R112 Nausea with vomiting, unspecified: Secondary | ICD-10-CM | POA: Diagnosis not present

## 2015-04-25 DIAGNOSIS — Z8719 Personal history of other diseases of the digestive system: Secondary | ICD-10-CM | POA: Diagnosis not present

## 2015-04-25 DIAGNOSIS — Z3202 Encounter for pregnancy test, result negative: Secondary | ICD-10-CM | POA: Insufficient documentation

## 2015-04-25 DIAGNOSIS — Z79899 Other long term (current) drug therapy: Secondary | ICD-10-CM | POA: Insufficient documentation

## 2015-04-25 DIAGNOSIS — Z87891 Personal history of nicotine dependence: Secondary | ICD-10-CM | POA: Diagnosis not present

## 2015-04-25 DIAGNOSIS — M797 Fibromyalgia: Secondary | ICD-10-CM | POA: Insufficient documentation

## 2015-04-25 DIAGNOSIS — G8929 Other chronic pain: Secondary | ICD-10-CM | POA: Insufficient documentation

## 2015-04-25 DIAGNOSIS — F329 Major depressive disorder, single episode, unspecified: Secondary | ICD-10-CM | POA: Diagnosis not present

## 2015-04-25 DIAGNOSIS — Z880 Allergy status to penicillin: Secondary | ICD-10-CM | POA: Diagnosis not present

## 2015-04-25 DIAGNOSIS — Z8619 Personal history of other infectious and parasitic diseases: Secondary | ICD-10-CM | POA: Diagnosis not present

## 2015-04-25 HISTORY — DX: Dorsalgia, unspecified: M54.9

## 2015-04-25 HISTORY — DX: Cervicalgia: M54.2

## 2015-04-25 HISTORY — DX: Other chronic pain: G89.29

## 2015-04-25 HISTORY — DX: Chronic fatigue, unspecified: R53.82

## 2015-04-25 HISTORY — DX: Hallucinations, unspecified: R44.3

## 2015-04-25 LAB — CBC WITH DIFFERENTIAL/PLATELET
Basophils Absolute: 0 10*3/uL (ref 0.0–0.1)
Basophils Relative: 0 % (ref 0–1)
EOS PCT: 1 % (ref 0–5)
Eosinophils Absolute: 0 10*3/uL (ref 0.0–0.7)
HCT: 39.7 % (ref 36.0–46.0)
Hemoglobin: 13.6 g/dL (ref 12.0–15.0)
LYMPHS PCT: 22 % (ref 12–46)
Lymphs Abs: 1.7 10*3/uL (ref 0.7–4.0)
MCH: 31.1 pg (ref 26.0–34.0)
MCHC: 34.3 g/dL (ref 30.0–36.0)
MCV: 90.6 fL (ref 78.0–100.0)
Monocytes Absolute: 0.6 10*3/uL (ref 0.1–1.0)
Monocytes Relative: 8 % (ref 3–12)
NEUTROS PCT: 69 % (ref 43–77)
Neutro Abs: 5.2 10*3/uL (ref 1.7–7.7)
Platelets: 473 10*3/uL — ABNORMAL HIGH (ref 150–400)
RBC: 4.38 MIL/uL (ref 3.87–5.11)
RDW: 12.5 % (ref 11.5–15.5)
WBC: 7.5 10*3/uL (ref 4.0–10.5)

## 2015-04-25 LAB — COMPREHENSIVE METABOLIC PANEL
ALBUMIN: 4.4 g/dL (ref 3.5–5.0)
ALT: 20 U/L (ref 14–54)
ANION GAP: 12 (ref 5–15)
AST: 23 U/L (ref 15–41)
Alkaline Phosphatase: 87 U/L (ref 38–126)
BILIRUBIN TOTAL: 1 mg/dL (ref 0.3–1.2)
BUN: 19 mg/dL (ref 6–20)
CALCIUM: 8.7 mg/dL — AB (ref 8.9–10.3)
CHLORIDE: 102 mmol/L (ref 101–111)
CO2: 21 mmol/L — AB (ref 22–32)
CREATININE: 0.86 mg/dL (ref 0.44–1.00)
GFR calc Af Amer: >60 mL/min (ref >60)
GFR calc non Af Amer: >60 mL/min (ref >60)
Glucose, Bld: 83 mg/dL (ref 65–99)
Potassium: 3.8 mmol/L (ref 3.5–5.1)
Sodium: 135 mmol/L (ref 135–145)
TOTAL PROTEIN: 7.8 g/dL (ref 6.5–8.1)

## 2015-04-25 LAB — URINALYSIS, ROUTINE W REFLEX MICROSCOPIC
Bilirubin Urine: NEGATIVE
GLUCOSE, UA: NEGATIVE mg/dL
HGB URINE DIPSTICK: NEGATIVE
LEUKOCYTES UA: NEGATIVE
Nitrite: NEGATIVE
PROTEIN: NEGATIVE mg/dL
Specific Gravity, Urine: 1.019 (ref 1.005–1.030)
Urobilinogen, UA: 0.2 mg/dL (ref 0.0–1.0)
pH: 6 (ref 5.0–8.0)

## 2015-04-25 LAB — POC URINE PREG, ED: Preg Test, Ur: NEGATIVE

## 2015-04-25 LAB — LIPASE, BLOOD: LIPASE: 19 U/L — AB (ref 22–51)

## 2015-04-25 LAB — I-STAT CG4 LACTIC ACID, ED: Lactic Acid, Venous: 0.88 mmol/L (ref 0.5–2.0)

## 2015-04-25 MED ORDER — DICYCLOMINE HCL 10 MG/ML IM SOLN
20.0000 mg | Freq: Once | INTRAMUSCULAR | Status: AC
  Administered 2015-04-25: 20 mg via INTRAMUSCULAR
  Filled 2015-04-25: qty 2

## 2015-04-25 MED ORDER — DICYCLOMINE HCL 20 MG PO TABS: 20.0000 mg | ORAL_TABLET | Freq: Four times a day (QID) | ORAL | Status: AC | PRN

## 2015-04-25 MED ORDER — ONDANSETRON HCL 4 MG/2ML IJ SOLN
4.0000 mg | INTRAMUSCULAR | Status: DC | PRN
  Filled 2015-04-25: qty 2

## 2015-04-25 MED ORDER — SODIUM CHLORIDE 0.9 % IV BOLUS (SEPSIS)
1000.0000 mL | Freq: Once | INTRAVENOUS | Status: AC
  Administered 2015-04-25: 1000 mL via INTRAVENOUS

## 2015-04-25 MED ORDER — ONDANSETRON HCL 4 MG/2ML IJ SOLN
4.0000 mg | Freq: Once | INTRAMUSCULAR | Status: AC
  Administered 2015-04-25: 4 mg via INTRAVENOUS
  Filled 2015-04-25: qty 2

## 2015-04-25 MED ORDER — SODIUM CHLORIDE 0.9 % IV SOLN
INTRAVENOUS | Status: DC
  Administered 2015-04-25: 19:00:00 via INTRAVENOUS

## 2015-04-25 MED ORDER — PROMETHAZINE HCL 25 MG PO TABS: 25.0000 mg | ORAL_TABLET | Freq: Four times a day (QID) | ORAL | Status: AC | PRN

## 2015-04-25 NOTE — ED Provider Notes (Signed)
CSN: [id number]     Arrival date & time 04/25/15  1640 History   First MD Initiated Contact with Patient 04/25/15 1712     Chief Complaint  Patient presents with  . Vomiting      HPI Pt was seen at 1745. Per pt and her family, c/o gradual onset and persistence of multiple intermittent episodes of N/V that began 5 days ago. Has been associated with generalized abd "pain" and decreased PO intake. Pt states her symptoms began after [facility] (after stopping her chronic narcotic pain meds) and started on new meds. Denies diarrhea, no CP/SOB, no back pain, no fevers, no black or blood in stools or emesis.     Past Medical History  Diagnosis Date  . ADHD (attention deficit hyperactivity disorder)   . Depression   . GERD (gastroesophageal reflux disease)   . Alcoholism in recovery   . Fibromyalgia syndrome     Extensive rheum w/u by Dr. Kathi LudwigSyed (Rheum) 06/2013 --all NORMAL.  Marland Kitchen. Abnormal Pap smear of cervix 2012  . Lyme disease   . Chronic fatigue   . Chronic pain   . Hallucinations 03/2015    s/p stopping narcotics  . Chronic neck and back pain    Past Surgical History  Procedure Laterality Date  . Hernia repair      Right inguinal and right femoral hernia (2 yrs apart, both repaired by Dr. Gerrit FriendsGerkin)  . Hand surgery Left 2004    Cat bite--I&D for infection  . Colposcopy     Family History  Problem Relation Age of Onset  . Diabetes Mother   . Hypertension Mother   . Heart disease Mother   . Hypertension Father   . Cancer Father     Prostate and melanoma  . Cancer Maternal Grandmother   . Heart disease Maternal Grandfather   . Heart disease Paternal Grandmother   . Heart disease Paternal Grandfather    History  Substance Use Topics  . Smoking status: Former Smoker -- 1.00 packs/day for 15 years    Types: Cigarettes    Quit date: 11/01/2011  . Smokeless tobacco: Never Used  . Alcohol Use: No    Review of Systems ROS: Statement: All systems  negative except as marked or noted in the HPI; Constitutional: Negative for fever and chills. ; ; Eyes: Negative for eye pain, redness and discharge. ; ; ENMT: Negative for ear pain, hoarseness, nasal congestion, sinus pressure and sore throat. ; ; Cardiovascular: Negative for chest pain, palpitations, diaphoresis, dyspnea and peripheral edema. ; ; Respiratory: Negative for cough, wheezing and stridor. ; ; Gastrointestinal: +N/V, abd pain. Negative for diarrhea, blood in stool, hematemesis, jaundice and rectal bleeding. . ; ; Genitourinary: Negative for dysuria, flank pain and hematuria. ; ; Musculoskeletal: Negative for back pain and neck pain. Negative for swelling and trauma.; ; Skin: Negative for pruritus, rash, abrasions, blisters, bruising and skin lesion.; ; Neuro: Negative for headache, lightheadedness and neck stiffness. Negative for weakness, altered level of consciousness , altered mental status, extremity weakness, paresthesias, involuntary movement, seizure and syncope.      Allergies  Vancomycin and Amoxicillin  Home Medications   Prior to Admission medications   Medication Sig Start Date End Date Taking? Authorizing Provider  LORazepam (ATIVAN) 0.5 MG tablet Take 0.5 mg by mouth every 6 (six) hours as needed for anxiety.   Yes Historical Provider, MD  ondansetron (ZOFRAN) 4 MG tablet Take 1 tablet (4 mg total) by  mouth every 8 (eight) hours as needed for nausea or vomiting. 08/30/14  Yes Vassie Loll, MD  risperiDONE (RISPERDAL M-TABS) 0.5 MG disintegrating tablet Take 1 tablet (0.5 mg total) by mouth 2 (two) times daily. 04/20/15  Yes Earney Navy, NP  traZODone (DESYREL) 100 MG tablet Take 1 tablet (100 mg total) by mouth at bedtime as needed for sleep. Patient taking differently: Take 100 mg by mouth at bedtime.  04/20/15  Yes Earney Navy, NP  benztropine (COGENTIN) 0.5 MG tablet Take 0.5 mg by mouth daily.  04/24/15   Historical Provider, MD  ferrous fumarate (HEMOCYTE -  106 MG FE) 325 (106 FE) MG TABS tablet Take 1 tablet (106 mg of iron total) by mouth 2 (two) times daily. Patient not taking: Reported on 04/25/2015 08/30/14   Vassie Loll, MD   BP 119/97 mmHg  Pulse 100  Temp(Src) 97.8 F (36.6 C) (Oral)  Resp 20  SpO2 96%  LMP 04/18/2015   19:16:57 Orthostatic Vital Signs ND  Orthostatic Lying  - BP- Lying: 126/70 mmHg ; Pulse- Lying: 93  Orthostatic Sitting - BP- Sitting: 134/85 mmHg ; Pulse- Sitting: 95  Orthostatic Standing at 0 minutes - BP- Standing at 0 minutes: 116/78 mmHg ; Pulse- Standing at 0 minutes: 107      Physical Exam  1750: Physical examination:  Nursing notes reviewed; Vital signs and O2 SAT reviewed;  Constitutional: Well developed, Well nourished, Well hydrated, In no acute distress; Head:  Normocephalic, atraumatic; Eyes: EOMI, PERRL, No scleral icterus; ENMT: Mouth and pharynx normal, Mucous membranes moist; Neck: Supple, Full range of motion, No lymphadenopathy; Cardiovascular: Regular rate and rhythm, No murmur, rub, or gallop; Respiratory: Breath sounds clear & equal bilaterally, No rales, rhonchi, wheezes.  Speaking full sentences with ease, Normal respiratory effort/excursion; Chest: Nontender, Movement normal; Abdomen: Soft, +mild diffuse tenderness to palp. No rebound or guarding. Nondistended, Normal bowel sounds; Genitourinary: No CVA tenderness; Extremities: Pulses normal, No tenderness, No edema, No calf edema or asymmetry.; Neuro: AA&Ox3, Major CN grossly intact.  Speech clear. No gross focal motor or sensory deficits in extremities.; Skin: Color normal, Warm, Dry.; Psych:  Affect flat, poor eye contact.    ED Course  Procedures      EKG Interpretation None      MDM  MDM Reviewed: previous chart, nursing note and vitals Reviewed previous: labs Interpretation: x-ray and labs      Results for orders placed or performed during the hospital encounter of 04/25/15  CBC with Differential  Result Value Ref Range    WBC 7.5 4.0 - 10.5 K/uL   RBC 4.38 3.87 - 5.11 MIL/uL   Hemoglobin 13.6 12.0 - 15.0 g/dL   HCT 16.1 09.6 - 04.5 %   MCV 90.6 78.0 - 100.0 fL   MCH 31.1 26.0 - 34.0 pg   MCHC 34.3 30.0 - 36.0 g/dL   RDW 40.9 81.1 - 91.4 %   Platelets 473 (H) 150 - 400 K/uL   Neutrophils Relative % 69 43 - 77 %   Neutro Abs 5.2 1.7 - 7.7 K/uL   Lymphocytes Relative 22 12 - 46 %   Lymphs Abs 1.7 0.7 - 4.0 K/uL   Monocytes Relative 8 3 - 12 %   Monocytes Absolute 0.6 0.1 - 1.0 K/uL   Eosinophils Relative 1 0 - 5 %   Eosinophils Absolute 0.0 0.0 - 0.7 K/uL   Basophils Relative 0 0 - 1 %   Basophils Absolute 0.0 0.0 - 0.1  K/uL  Comprehensive metabolic panel  Result Value Ref Range   Sodium 135 135 - 145 mmol/L   Potassium 3.8 3.5 - 5.1 mmol/L   Chloride 102 101 - 111 mmol/L   CO2 21 (L) 22 - 32 mmol/L   Glucose, Bld 83 65 - 99 mg/dL   BUN 19 6 - 20 mg/dL   Creatinine, Ser 1.61 0.44 - 1.00 mg/dL   Calcium 8.7 (L) 8.9 - 10.3 mg/dL   Total Protein 7.8 6.5 - 8.1 g/dL   Albumin 4.4 3.5 - 5.0 g/dL   AST 23 15 - 41 U/L   ALT 20 14 - 54 U/L   Alkaline Phosphatase 87 38 - 126 U/L   Total Bilirubin 1.0 0.3 - 1.2 mg/dL   GFR calc non Af Amer >60 >60 mL/min   GFR calc Af Amer >60 >60 mL/min   Anion gap 12 5 - 15  Lipase, blood  Result Value Ref Range   Lipase 19 (L) 22 - 51 U/L  Urinalysis, Routine w reflex microscopic (not at [facility])  Result Value Ref Range   Color, Urine YELLOW YELLOW   APPearance CLEAR CLEAR   Specific Gravity, Urine 1.019 1.005 - 1.030   pH 6.0 5.0 - 8.0   Glucose, UA NEGATIVE NEGATIVE mg/dL   Hgb urine dipstick NEGATIVE NEGATIVE   Bilirubin Urine NEGATIVE NEGATIVE   Ketones, ur >80 (A) NEGATIVE mg/dL   Protein, ur NEGATIVE NEGATIVE mg/dL   Urobilinogen, UA 0.2 0.0 - 1.0 mg/dL   Nitrite NEGATIVE NEGATIVE   Leukocytes, UA NEGATIVE NEGATIVE  POC Urine Pregnancy, ED  (If Pre-menopausal female)  not at [facility]  Result Value Ref Range   Preg Test, Ur NEGATIVE NEGATIVE  I-Stat CG4  Lactic Acid, ED  Result Value Ref Range   Lactic Acid, Venous 0.88 0.5 - 2.0 mmol/L   Dg Abd Acute W/chest 04/25/2015   CLINICAL DATA:  Nausea. Vomiting. Abdominal pain. Initial encounter.  EXAM: DG ABDOMEN ACUTE W/ 1V CHEST  COMPARISON:  None.  FINDINGS: There is no evidence of dilated bowel loops or free intraperitoneal air. No radiopaque calculi or other significant radiographic abnormality is seen. Heart size and mediastinal contours are within normal limits. Both lungs are clear.  IMPRESSION: Negative abdominal radiographs.  No acute cardiopulmonary disease.   Electronically Signed   By: Andreas Newport M.D.   On: 04/25/2015 18:20    2025:  Pt has tol PO well while in the ED without N/V.  No stooling while in the ED.  Abd benign, VSS. Feels better and wants to go home now. Tx symptomatically at this time. Dx and testing d/w pt and family.  Questions answered.  Verb understanding, agreeable to d/c home with outpt f/u.    Samuel Jester, DO 04/28/15 1732

## 2015-04-25 NOTE — ED Notes (Signed)
Patient transported to X-ray 

## 2015-04-25 NOTE — ED Notes (Signed)
Pt refused ginger ale, stated she only drinks water due to being a "Lyme Patient." Patient given water and instructed to notify staff if she can't tolerate it.

## 2015-04-25 NOTE — ED Notes (Signed)
Pt reports she was having some "medication problems that were making her see things". Pt reports that this episode was happening last week. Pt reports she has not eaten in approx one week, has lost approx 25 pounds in that one week, and has been vomiting and unable to keep anything down.

## 2015-04-25 NOTE — Discharge Instructions (Signed)
°Emergency Department Resource Guide °1) Find a Doctor and Pay Out of Pocket °Although you won't have to find out who is covered by your insurance plan, it is a good idea to ask around and get recommendations. You will then need to call the office and see if the doctor you have chosen will accept you as a new patient and what types of options they offer for patients who are self-pay. Some doctors offer discounts or will set up payment plans for their patients who do not have insurance, but you will need to ask so you aren't surprised when you get to your appointment. ° °2) Contact Your Local Health Department °Not all health departments have doctors that can see patients for sick visits, but many do, so it is worth a call to see if yours does. If you don't know where your local health department is, you can check in your phone book. The CDC also has a tool to help you locate your state's health department, and many state websites also have listings of all of their local health departments. ° °3) Find a Walk-in Clinic °If your illness is not likely to be very severe or complicated, you may want to try a walk in clinic. These are popping up all over the country in pharmacies, drugstores, and shopping centers. They're usually staffed by nurse practitioners or physician assistants that have been trained to treat common illnesses and complaints. They're usually fairly quick and inexpensive. However, if you have serious medical issues or chronic medical problems, these are probably not your best option. ° °No Primary Care Doctor: °- Call Health Connect at  832-8000 - they can help you locate a primary care doctor that  accepts your insurance, provides certain services, etc. °- Physician Referral Service- 1-800-533-3463 ° °Chronic Pain Problems: °Organization         Address  Phone   Notes  °[facility]  (336) [phone] Patients need to be referred by their primary care doctor.  ° °Medication  Assistance: °Organization         Address  Phone   Notes  °[place] [facility] [address] °[place], [place] [address] (336) 641-8030 --Must be a resident of [place] °-- Must have NO insurance coverage whatsoever (no Medicaid/ Medicare, etc.) °-- The pt. MUST have a primary care doctor that directs their care regularly and follows them in the community °  °[facility]  (866) 331-1348   °[employer]  (888) 892-1162   ° °Agencies that provide inexpensive medical care: °Organization         Address  Phone   Notes  °[facility]  (336) 832-8035   °[facility]    (336) 832-7272   °[facility] [address] °[place], [place] [address] (336) 832-4777   °[facility] [address], °[place] (336) 271-4999   °[facility]    (336) 373-0678   °[facility]    (336) 272-1050   °[facility] ° [address], [place] Phone:  (336) 832-4444, Fax:  (336) 832-4440 Hours of Operation:  9 am - 6 pm, M-F.  Also accepts Medicaid/Medicare and self-pay.  °[facility] ° [address], [place] Phone: (336) [phone], Fax: (336) [phone]. Hours of Operation:  8:30 am - 5:30 pm, M-F.  Also accepts Medicaid and self-pay.  °[facility] [place] [address], [place] Phone: (336) 878-6027   °[facility] [address], [place], [place] (336)723-1848, Ext. 123 Mondays & Thursdays: 7-9 AM.  First 15 patients are seen on a first come, first serve basis. °  ° °Medicaid-accepting [place] Providers: ° °Organization         Address  Phone   Notes  °[facility] [address], [place] (336) [phone] Also accepts self-pay patients.  °[facility] [address], [place] ° (336) 856-9996   °[facility] [address], [place]  (336) 288-8857   °[facility] [address] [place] [address], [place] (336) 299-7000   °Veita Bland [address], [place]  ° (336) 373-1557 Only accepts [place] Access Medicaid patients after they have their name applied to their card.  ° °Self-Pay (no insurance) in [place]: ° °Organization         Address  Phone   Notes  °Sickle Cell Patients, [facility] [address], [place] (336) 832-1970   °[facility] [address], [place] (336) 832-4400   °[facility] ° [address], [place] (336) 992-4800   °[facility]/Dr. Osei-Bonsu ° [address] [place] [address], [place] or [address], [place] (336) [phone] Phone number for both [place] and [place] locations is the same.  °[facility] [address], [place] (336) 299-0000   °[facility] [address] [place] [address], [place] or [address] (336) 852-7530 °(336) 878-2260   °[facility] [address], [place] (336) 350-1642, phone; (336) 294-5005, fax Sees patients 1st and 3rd Saturday of every month.  Must not qualify for public or private insurance (i.e. Medicaid, Medicare, [place] Health Choice, Veterans' Benefits) • Household income should be no more than 200% of the poverty level •The clinic cannot treat you if you are pregnant or think you are pregnant • Sexually transmitted diseases are not treated at the clinic.  ° ° °Dental Care: °Organization         Address  Phone  Notes  °[place] [facility] [address], [place] (336) 641-6152 Accepts children up to age 21 who are enrolled in Medicaid or [place] Health Choice; pregnant women with a Medicaid card; and children who have applied for Medicaid or [place] Health Choice, but were declined, whose parents can pay a reduced fee at time of service.  °[place]  [facility] [place]  [address], [place] (336) 641-7733 Accepts children up to age 21 who are enrolled in Medicaid or [place] Health Choice; pregnant women with a Medicaid card; and children who have applied for Medicaid or [place] Health Choice, but were declined, whose parents can pay a reduced fee at time of service.  °[facility] ° [address], [place] (336) 641-4533 Patients are seen by appointment only. Walk-ins are not accepted. [facility] will see patients 18 years of age and older. °Monday - Tuesday (8am-5pm) °Most Wednesdays (8:30-5pm) °$30 per visit, cash only  °[facility] ° [address], [place] (336) 641-4533 Patients are seen by appointment only. Walk-ins are not accepted. [facility] will see patients 18 years of age and older. °One   Wednesday Evening (Monthly: Volunteer Based).  $30 per visit, cash only  °[facility]  (919) 537-3737 for adults; Children under age 4, call Graduate Pediatric Dentistry at ([phone]) [phone]. Children aged 4-14, please call ([phone]) [phone] to request a pediatric application. ° Dental services are provided in all areas of dental care including fillings, crowns and bridges, complete and partial dentures, implants, gum treatment, root canals, and extractions. Preventive care is also provided. Treatment is provided to both adults and children. °Patients are selected via a lottery and there is often a waiting list. °  °[facility] [address], °[place] ° (336) 763-8833 [email or web address] °  °[facility] [address], [place], [place] (336)723-1848, Ext. 123 Second and Fourth Thursday of each month, opens at 6:30 AM; Clinic ends at 9 AM.  Patients are seen on a first-come first-served basis, and a limited number are seen during each clinic.  ° °[facility] ° [address], [place], [place] (336) 723-7904    Eligibility Requirements °You must have lived in [place], [place], or [place] counties for at least the last three months. °  You cannot be eligible for state or federal sponsored healthcare insurance, including Veterans Administration, Medicaid, or Medicare. °  You generally cannot be eligible for healthcare insurance through your employer.  °  How to apply: °Eligibility screenings are held every Tuesday and Wednesday afternoon from 1:00 pm until 4:00 pm. You do not need an appointment for the interview!  °[facility] [address], [place], [place] 336-631-2330   °[place] [facility]  336-342-8273   °[place]  336-703-3100   °[facility]  336-570-6415   ° °Behavioral Health Resources in the Community: °Intensive Outpatient Programs °Organization         Address  Phone  Notes  °[facility] [address], [place], [place] 336-878-6098   °[facility] [address], [place], [place] 336-832-9800   °[facility] [address], [place], [place] ° 336-882-2125   °[facility] [address],  °[place], [place] 1-800-853-5163 or 336-641-4981   °Substance Abuse Resources °Organization         Address  Phone  Notes  °[facility]  336-882-2125   °[facility]  336-784-9470   °[facility]  336-285-9073   °[facility]  336-845-3988   °Residential & Outpatient Substance Abuse Program  1-800-659-3381   °Psychological Services °Organization         Address  Phone  Notes  °[facility]  336- 832-9600   °[facility]  336- 378-7881   °[facility] [address], [place] 1-800-853-5163 or 336-641-4981   ° °Mobile Crisis Teams °Organization         Address  Phone  Notes  °[facility], Mobile Crisis Care Unit  1-877-626-1772   °Assertive °[facility] ° [address]  [place], [place] 336-834-9664   °Sharon DeEsch [address] °[place] 336-554-5454   ° °Self-Help/Support Groups °Organization         Address  Phone             Notes  °[facility]  - variety of support groups  336- 373-1402 Call for more information  °Narcotics Anonymous (NA), [facility] [address], °[place]  2 meetings at this location  ° °  Residential Treatment Programs Organization         Address  Phone  Notes  [facility] [address],    [place]  [phone]   [facility]  [address], [place] [phone], [place], [place] [phone]   [facility] [address], [place] [phone] Admissions: 8am-3pm M-F  [facility] [address]    [place], [place] 295-284-1324([phone]   [facility] [address], [place], [place] [phone]   [facility] [address]  [place], [place] [phone]   [facility] Intensive Outpatient [address], [place], [place] [phone]   [facility] ([facility] [address]  [place], [place] [phone]-(330) 163-3083 or [phone](321) 112-1712   [facility] [address] [place], [place] [phone] Accepts Medicaid  [facility] [address]  [place] [phone] Substance Abuse/Addiction Treatment   [facility] Organization         Address  Phone  Notes  [facility]  8456622252(888) 682-093-4947   Angie FavaJulie Brannon, PhD [address], [address] [place], [place]   (934) [phone](336) [phone] or [phone](336) [phone]   [facility]   [address] [place], [place] 336-392-1033(336) [phone]   [facility] [address], [place], [place] [phone](336) [phone] Insurance/Medicaid/sponsorship through [facility] [address]                                    [place], [place] [phone](336) [phone] Therapy/tele-psych/case    [facility] [address]   [place], [place] [phone](336) [phone]    Dr. Lolly MustacheArfeen  [phone](336) [phone]   [facility]  [employer] [facility] 1) [address], [place] 2) [address], [place] 3)  [address], [place] [phone](336) [phone](336) [phone]  [phone](336) [phone]   [facility] Child Abuse Hotline 817-238-9063(336) 236-534-8009 or 336-237-9137(336) [phone] (After Hours)      Take the prescriptions as directed.  Increase your fluid intake (ie:  Gatoraide, Pedialyte) for the next few days, as discussed.  Eat a bland diet and advance to your regular diet slowly as you can tolerate it.  Call your regular medical doctor Monday to schedule a follow up appointment in the next 3 days.  Return to the Emergency Department immediately if not improving (or even worsening) despite taking the medicines as prescribed, any black or bloody stool or vomit, if you develop a fever over "101," or for any other concerns.

## 2015-04-25 NOTE — ED Notes (Addendum)
Pt able to consume the cup of ice water, tolerated well---- pt denies nausea at this time.

## 2015-04-25 NOTE — ED Notes (Signed)
Pt given ginger ale to drink. 

## 2015-04-25 NOTE — ED Notes (Signed)
Pt. Refused vital signs when rounding on pt. RN, Isaias CowmanAllan made aware.

## 2015-11-06 ENCOUNTER — Other Ambulatory Visit: Payer: Self-pay | Admitting: Obstetrics

## 2015-11-06 DIAGNOSIS — R928 Other abnormal and inconclusive findings on diagnostic imaging of breast: Secondary | ICD-10-CM

## 2015-11-17 ENCOUNTER — Ambulatory Visit
Admission: RE | Admit: 2015-11-17 | Discharge: 2015-11-17 | Disposition: A | Discharge status: Home / Self Care | Payer: BC Managed Care – PPO | Source: Ambulatory Visit | Attending: Obstetrics | Admitting: Obstetrics

## 2015-11-17 DIAGNOSIS — R928 Other abnormal and inconclusive findings on diagnostic imaging of breast: Secondary | ICD-10-CM

## 2016-02-03 ENCOUNTER — Other Ambulatory Visit: Payer: Self-pay | Admitting: Internal Medicine

## 2016-05-06 ENCOUNTER — Ambulatory Visit (HOSPITAL_BASED_OUTPATIENT_CLINIC_OR_DEPARTMENT_OTHER): Payer: BC Managed Care – PPO | Attending: Specialist | Admitting: Internal Medicine

## 2016-05-06 VITALS — Ht 66.0 in | Wt 205.0 lb

## 2016-05-06 DIAGNOSIS — Z79899 Other long term (current) drug therapy: Secondary | ICD-10-CM | POA: Insufficient documentation

## 2016-05-06 DIAGNOSIS — G4719 Other hypersomnia: Secondary | ICD-10-CM | POA: Diagnosis present

## 2016-05-06 DIAGNOSIS — G4710 Hypersomnia, unspecified: Secondary | ICD-10-CM

## 2016-05-06 DIAGNOSIS — R5383 Other fatigue: Secondary | ICD-10-CM | POA: Diagnosis not present

## 2016-05-06 DIAGNOSIS — R0683 Snoring: Secondary | ICD-10-CM | POA: Diagnosis not present

## 2016-05-09 DIAGNOSIS — R5383 Other fatigue: Secondary | ICD-10-CM

## 2016-05-09 DIAGNOSIS — G4710 Hypersomnia, unspecified: Secondary | ICD-10-CM | POA: Diagnosis not present

## 2016-05-09 NOTE — Procedures (Signed)
   Patient Name: Miranda [name], Miranda [name] Study Date: 05/06/2016 Gender: Female D.O.B: 09-Feb-1966 Age (yearGlori [name]): 49 Referring Provider: Aletha HalimAndreas Runheim Height (inches): 66 Interpreting Physician: Jetty Duhamellinton Tyrianna Lightle MD, ABSM Weight (lbs): 209 RPSGT: Miranda [name], Miranda [name] BMI: 34 MRN: Neck Size: 14.50 CLINICAL INFORMATION Sleep Study Type: NPSG Indication for sleep study: Fatigue Epworth Sleepiness Score: 11  SLEEP STUDY TECHNIQUE As per the AASM Manual for the Scoring of Sleep and Associated Events v2.3 (April 2016) with a hypopnea requiring 4% desaturations. The channels recorded and monitored were frontal, central and occipital EEG, electrooculogram (EOG), submentalis EMG (chin), nasal and oral airflow, thoracic and abdominal wall motion, anterior tibialis EMG, snore microphone, electrocardiogram, and pulse oximetry.  MEDICATIONS Patient's medications include: charted for review Medications self-administered by patient during sleep study : TRAZODONE.  SLEEP ARCHITECTURE The study was initiated at 11:05:42 PM and ended at 5:16:52 AM. Sleep onset time was 16.3 minutes and the sleep efficiency was 80.6%. The total sleep time was 299.0 minutes. Stage REM latency was 352.0 minutes. The patient spent 11.04% of the night in stage N1 sleep, 88.13% in stage N2 sleep, 0.00% in stage N3 and 0.84% in REM. Alpha intrusion was absent. Supine sleep was 69.26%.  RESPIRATORY PARAMETERS The overall apnea/hypopnea index (AHI) was 0.0 per hour. There were 0 total apneas, including 0 obstructive, 0 central and 0 mixed apneas. There were 0 hypopneas and 0 RERAs. The AHI during Stage REM sleep was 0.0 per hour. AHI while supine was 0.0 per hour. The mean oxygen saturation was 95.66%. The minimum SpO2 during sleep was 92.00%. Loud snoring was noted during this study.  CARDIAC DATA The 2 lead EKG demonstrated sinus rhythm. The mean heart rate was 76.57 beats per minute. Other EKG findings include: None.  LEG  MOVEMENT DATA The total PLMS were 0 with a resulting PLMS index of 0.00. Associated arousal with leg movement index was 0.0 .  IMPRESSIONS - No significant obstructive sleep apnea occurred during this study (AHI = 0.0/h). - No significant central sleep apnea occurred during this study (CAI = 0.0/h). - The patient had minimal or no oxygen desaturation during the study (Min O2 = 92.00%) - The patient snored with Loud snoring volume. - No cardiac abnormalities were noted during this study. - Clinically significant periodic limb movements did not occur during sleep. No significant associated arousals.  DIAGNOSIS - Primary Snoring (786.09 [R06.83 ICD-10]) - Normal study  RECOMMENDATIONS - Avoid alcohol, sedatives and other CNS depressants that may worsen sleep apnea and disrupt normal sleep architecture. - Sleep hygiene should be reviewed to assess factors that may improve sleep quality. - Weight management and regular exercise should be initiated or continued if appropriate.  [Electronically signed] 05/09/2016 10:35 AM  Jetty Duhamellinton Indigo Chaddock MD, ABSM Diplomate, American Board of Sleep Medicine   NPI: [id number] Waymon BudgeYOUNG,Michai Dieppa D Diplomate, American Board of Sleep Medicine  ELECTRONICALLY SIGNED ON:  05/09/2016, 10:33 AM [facility] PH: [phone]   FX: [phone] ACCREDITED BY THE AMERICAN ACADEMY OF SLEEP MEDICINE

## 2018-01-18 ENCOUNTER — Ambulatory Visit: Payer: BC Managed Care – PPO | Admitting: Psychology

## 2018-02-03 ENCOUNTER — Ambulatory Visit (INDEPENDENT_AMBULATORY_CARE_PROVIDER_SITE_OTHER): Payer: BC Managed Care – PPO | Admitting: Psychology

## 2018-02-03 DIAGNOSIS — F4323 Adjustment disorder with mixed anxiety and depressed mood: Secondary | ICD-10-CM

## 2018-02-15 ENCOUNTER — Ambulatory Visit: Payer: BC Managed Care – PPO | Admitting: Psychology

## 2018-02-15 DIAGNOSIS — F4323 Adjustment disorder with mixed anxiety and depressed mood: Secondary | ICD-10-CM | POA: Diagnosis not present

## 2018-03-03 ENCOUNTER — Ambulatory Visit: Payer: BC Managed Care – PPO | Admitting: Psychology

## 2018-03-23 ENCOUNTER — Ambulatory Visit: Payer: BC Managed Care – PPO | Admitting: Psychology

## 2018-03-23 DIAGNOSIS — F4323 Adjustment disorder with mixed anxiety and depressed mood: Secondary | ICD-10-CM

## 2018-04-18 ENCOUNTER — Ambulatory Visit: Payer: BC Managed Care – PPO | Admitting: Psychology

## 2018-04-18 DIAGNOSIS — F4323 Adjustment disorder with mixed anxiety and depressed mood: Secondary | ICD-10-CM | POA: Diagnosis not present

## 2018-04-21 DIAGNOSIS — M25562 Pain in left knee: Secondary | ICD-10-CM | POA: Insufficient documentation

## 2018-05-08 ENCOUNTER — Ambulatory Visit: Payer: BC Managed Care – PPO | Admitting: Psychology

## 2018-05-08 DIAGNOSIS — F4323 Adjustment disorder with mixed anxiety and depressed mood: Secondary | ICD-10-CM

## 2018-06-05 ENCOUNTER — Ambulatory Visit: Payer: BC Managed Care – PPO | Admitting: Psychology

## 2018-06-05 DIAGNOSIS — F4323 Adjustment disorder with mixed anxiety and depressed mood: Secondary | ICD-10-CM

## 2018-06-19 ENCOUNTER — Ambulatory Visit: Payer: BC Managed Care – PPO | Admitting: Psychology

## 2018-07-17 ENCOUNTER — Ambulatory Visit: Payer: BC Managed Care – PPO | Admitting: Psychology

## 2018-07-31 ENCOUNTER — Ambulatory Visit: Payer: BC Managed Care – PPO | Admitting: Psychology

## 2018-07-31 DIAGNOSIS — F4323 Adjustment disorder with mixed anxiety and depressed mood: Secondary | ICD-10-CM | POA: Diagnosis not present

## 2018-08-14 ENCOUNTER — Ambulatory Visit: Payer: BC Managed Care – PPO | Admitting: Psychology

## 2018-08-28 ENCOUNTER — Ambulatory Visit: Payer: Self-pay | Admitting: Psychology

## 2018-10-04 ENCOUNTER — Ambulatory Visit: Payer: BC Managed Care – PPO | Admitting: Psychology

## 2018-10-05 ENCOUNTER — Other Ambulatory Visit: Payer: Self-pay | Admitting: Neurosurgery

## 2018-10-05 DIAGNOSIS — M5416 Radiculopathy, lumbar region: Secondary | ICD-10-CM

## 2018-10-09 ENCOUNTER — Ambulatory Visit (INDEPENDENT_AMBULATORY_CARE_PROVIDER_SITE_OTHER): Payer: BC Managed Care – PPO

## 2018-10-09 ENCOUNTER — Other Ambulatory Visit: Payer: Self-pay | Admitting: Podiatry

## 2018-10-09 ENCOUNTER — Ambulatory Visit: Payer: BC Managed Care – PPO | Admitting: Podiatry

## 2018-10-09 ENCOUNTER — Encounter: Payer: Self-pay | Admitting: Podiatry

## 2018-10-09 VITALS — BP 115/74 | HR 85

## 2018-10-09 DIAGNOSIS — S90851A Superficial foreign body, right foot, initial encounter: Secondary | ICD-10-CM | POA: Diagnosis not present

## 2018-10-09 DIAGNOSIS — M7751 Other enthesopathy of right foot: Secondary | ICD-10-CM

## 2018-10-09 NOTE — Patient Instructions (Signed)
Pre-Operative Instructions  Congratulations, you have decided to take an important step towards improving your quality of life.  You can be assured that the doctors and staff at [facility] will be with you every step of the way.  Here are some important things you should know:  1. Plan to be at the surgery center/hospital at least 1 (one) hour prior to your scheduled time, unless otherwise directed by the surgical center/hospital staff.  You must have a responsible adult accompany you, remain during the surgery and drive you home.  Make sure you have directions to the surgical center/hospital to ensure you arrive on time. 2. If you are having surgery at [facility] or [facility], you will need a copy of your medical history and physical form from your family physician within one month prior to the date of surgery. We will give you a form for your primary physician to complete.  3. We make every effort to accommodate the date you request for surgery.  However, there are times where surgery dates or times have to be moved.  We will contact you as soon as possible if a change in schedule is required.   4. No aspirin/ibuprofen for one week before surgery.  If you are on aspirin, any non-steroidal anti-inflammatory medications (Mobic, Aleve, Ibuprofen) should not be taken seven (7) days prior to your surgery.  You make take Tylenol for pain prior to surgery.  5. Medications - If you are taking daily heart and blood pressure medications, seizure, reflux, allergy, asthma, anxiety, pain or diabetes medications, make sure you notify the surgery center/hospital before the day of surgery so they can tell you which medications you should take or avoid the day of surgery. 6. No food or drink after midnight the night before surgery unless directed otherwise by surgical center/hospital staff. 7. No alcoholic beverages 24-hours prior to surgery.  No smoking 24-hours prior or 24-hours after  surgery. 8. Wear loose pants or shorts. They should be loose enough to fit over bandages, boots, and casts. 9. Don't wear slip-on shoes. Sneakers are preferred. 10. Bring your boot with you to the surgery center/hospital.  Also bring crutches or a walker if your physician has prescribed it for you.  If you do not have this equipment, it will be provided for you after surgery. 11. If you have not been contacted by the surgery center/hospital by the day before your surgery, call to confirm the date and time of your surgery. 12. Leave-time from work may vary depending on the type of surgery you have.  Appropriate arrangements should be made prior to surgery with your employer. 13. Prescriptions will be provided immediately following surgery by your doctor.  Fill these as soon as possible after surgery and take the medication as directed. Pain medications will not be refilled on weekends and must be approved by the doctor. 14. Remove nail polish on the operative foot and avoid getting pedicures prior to surgery. 15. Wash the night before surgery.  The night before surgery wash the foot and leg well with water and the antibacterial soap provided. Be sure to pay special attention to beneath the toenails and in between the toes.  Wash for at least three (3) minutes. Rinse thoroughly with water and dry well with a towel.  Perform this wash unless told not to do so by your physician.  Enclosed: 1 Ice pack (please put in freezer the night before surgery)   1 Hibiclens skin cleaner     Pre-op instructions  If you have any questions regarding the instructions, please do not hesitate to call our office.  [place]: [address], [place], [place] [address] -- [phone]  [place]: [address] [place], [place] [address] -- [phone]  [place]: [address]  [place], [place] [address] -- [phone]  [place]: [address], [address], [place], [place] [address] -- [phone]  Website:  [email or web address] 

## 2018-10-12 NOTE — Progress Notes (Signed)
   HPI: 52 year old female presenting today as a new patient with a chief complaint of a foreign body embedded in the right great toe that occurred three weeks ago. She states she dropped a sewing needle and then stepped on it, causing the needle to become lodged in the toe. She was seen by Dr. Elijah Birkom and was told it would stay where it is but she reports it has been moving around and is causing significant pain. Walking increases the pain. She has not done anything for treatment. Patient is here for further evaluation and treatment.   Past Medical History:  Diagnosis Date  . Abnormal Pap smear of cervix 2012  . ADHD (attention deficit hyperactivity disorder)   . 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 Chronic fatigue   . Chronic neck and back pain   . Chronic pain   . Depression   . Fibromyalgia syndrome    Extensive rheum w/u by Dr. Kathi LudwigSyed (Rheum) 06/2013 --all NORMAL.  Marland Kitchen. GERD (gastroesophageal reflux disease)   . Hallucinations 03/2015   s/p stopping narcotics  . Lyme disease      Physical Exam: General: The patient is alert and oriented x3 in no acute distress.  Dermatology: Skin is warm, dry and supple bilateral lower extremities. Negative for open lesions or macerations.  Vascular: Palpable pedal pulses bilaterally. No edema or erythema noted. Capillary refill within normal limits.  Neurological: Epicritic and protective threshold grossly intact bilaterally.   Musculoskeletal Exam: Pain with palpation to the right 1st MPJ. Range of motion within normal limits to all pedal and ankle joints bilateral. Muscle strength 5/5 in all groups bilateral.   Radiographic Exam:  Foreign body noted around the right 1st MPJ. Normal osseous mineralization. Joint spaces preserved. No fracture/dislocation/boney destruction.    Assessment: 1. Sewing needle/foreign body right 1st MPJ   Plan of Care:  1. Patient evaluated. X-Rays reviewed.  2. Today we discussed the conservative versus surgical management  of the presenting pathology. The patient opts for surgical management. All possible complications and details of the procedure were explained. All patient questions were answered. No guarantees were expressed or implied. 3. Authorization for surgery was initiated today. Surgery will consist of removal of foreign body right foot.  4. Return to clinic one week post op.   Friend's name is Fannie KneeSue.      Felecia ShellingBrent M. Evans, DPM [facility]  Dr. Felecia ShellingBrent M. Evans, DPM    [address]                                        [place], [place] [address]                Office [phone](336) [phone]  Fax [phone](336) [phone]

## 2018-10-27 ENCOUNTER — Other Ambulatory Visit: Payer: Self-pay

## 2018-10-30 ENCOUNTER — Other Ambulatory Visit: Payer: Self-pay

## 2018-11-09 ENCOUNTER — Other Ambulatory Visit: Payer: Self-pay

## 2018-11-16 ENCOUNTER — Telehealth: Payer: Self-pay | Admitting: *Deleted

## 2018-11-16 NOTE — Telephone Encounter (Signed)
"  I was informed that you went to the surgical center today for surgery.  "Yes, I did."  I am calling to get you scheduled.  "That would be great."  He can do it for you tomorrow if you would like.  "Yes, I can do that.  That would be great!  Thank you so much.  What time will I need to get there?"  Someone from the surgical center will call and give you your arrival time.

## 2018-11-17 ENCOUNTER — Encounter: Payer: Self-pay | Admitting: Podiatry

## 2018-11-17 ENCOUNTER — Other Ambulatory Visit: Payer: Self-pay | Admitting: Podiatry

## 2018-11-17 DIAGNOSIS — M795 Residual foreign body in soft tissue: Secondary | ICD-10-CM | POA: Diagnosis not present

## 2018-11-17 MED ORDER — HYDROCODONE-ACETAMINOPHEN 5-325 MG PO TABS: 1.0000 | ORAL_TABLET | Freq: Four times a day (QID) | ORAL | 0 refills | Status: AC | PRN

## 2018-11-17 NOTE — Progress Notes (Signed)
.  postop

## 2018-11-20 ENCOUNTER — Ambulatory Visit (INDEPENDENT_AMBULATORY_CARE_PROVIDER_SITE_OTHER): Payer: BC Managed Care – PPO

## 2018-11-20 ENCOUNTER — Ambulatory Visit (INDEPENDENT_AMBULATORY_CARE_PROVIDER_SITE_OTHER): Payer: BC Managed Care – PPO | Admitting: Podiatry

## 2018-11-20 DIAGNOSIS — Z09 Encounter for follow-up examination after completed treatment for conditions other than malignant neoplasm: Secondary | ICD-10-CM | POA: Diagnosis not present

## 2018-11-20 DIAGNOSIS — S90851A Superficial foreign body, right foot, initial encounter: Secondary | ICD-10-CM | POA: Diagnosis not present

## 2018-11-21 ENCOUNTER — Ambulatory Visit: Payer: BC Managed Care – PPO | Admitting: Psychology

## 2018-11-26 NOTE — Progress Notes (Signed)
   Subjective:  Patient presents today status post removal of foreign body right foot. DOS: 11/16/2018. She states she is doing well. She denies any pain or modifying factors. She has been using the post op shoe as directed. Patient is here for further evaluation and treatment.    Past Medical History:  Diagnosis Date  . Abnormal Pap smear of cervix 2012  . ADHD (attention deficit hyperactivity disorder)   . 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 Chronic fatigue   . Chronic neck and back pain   . Chronic pain   . Depression   . Fibromyalgia syndrome    Extensive rheum w/u by Dr. Kathi LudwigSyed (Rheum) 06/2013 --all NORMAL.  Marland Kitchen. GERD (gastroesophageal reflux disease)   . Hallucinations 03/2015   s/p stopping narcotics  . Lyme disease       Objective/Physical Exam Neurovascular status intact.  Skin incisions appear to be well coapted with sutures and staples intact. No sign of infectious process noted. No dehiscence. No active bleeding noted. Moderate edema noted to the surgical extremity.  Radiographic Exam:  Normal osseous mineralization. Joint spaces preserved. No fracture/dislocation/boney destruction.    Assessment: 1. s/p removal of foreign body right foot. DOS: 11/16/2018   Plan of Care:  1. Patient was evaluated. X-rays reviewed 2. Dressing changed.  3. Continue using post op shoe.  4. Return to clinic in one week for suture removal.    Felecia ShellingBrent M. Farrell Broerman, DPM [facility]  Dr. Felecia ShellingBrent M. Pryce Folts, DPM    [address]                                        [place], [place] [address]                Office [phone](336) [phone]  Fax [phone](336) [phone]

## 2018-12-04 ENCOUNTER — Ambulatory Visit
Admission: RE | Admit: 2018-12-04 | Discharge: 2018-12-04 | Disposition: A | Discharge status: Home / Self Care | Payer: BC Managed Care – PPO | Source: Ambulatory Visit | Attending: Neurosurgery | Admitting: Neurosurgery

## 2018-12-04 DIAGNOSIS — M5416 Radiculopathy, lumbar region: Secondary | ICD-10-CM

## 2018-12-06 ENCOUNTER — Ambulatory Visit (INDEPENDENT_AMBULATORY_CARE_PROVIDER_SITE_OTHER): Payer: Self-pay | Admitting: Podiatry

## 2018-12-06 DIAGNOSIS — Z09 Encounter for follow-up examination after completed treatment for conditions other than malignant neoplasm: Secondary | ICD-10-CM

## 2018-12-06 DIAGNOSIS — S90851A Superficial foreign body, right foot, initial encounter: Secondary | ICD-10-CM

## 2018-12-11 NOTE — Progress Notes (Signed)
   Subjective:  Patient presents today status post removal of foreign body right foot. DOS: 11/16/2018. She states she is doing well. She denies any pain or modifying factors. She denies any new complaints at this time. Patient is here for further evaluation and treatment.    Past Medical History:  Diagnosis Date  . Abnormal Pap smear of cervix 2012  . ADHD (attention deficit hyperactivity disorder)   . 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 Chronic fatigue   . Chronic neck and back pain   . Chronic pain   . Depression   . Fibromyalgia syndrome    Extensive rheum w/u by Dr. Kathi Ludwig (Rheum) 06/2013 --all NORMAL.  Marland Kitchen GERD (gastroesophageal reflux disease)   . Hallucinations 03/2015   s/p stopping narcotics  . Lyme disease       Objective/Physical Exam Neurovascular status intact.  Skin incisions appear to be well coapted with sutures and staples intact. No sign of infectious process noted. No dehiscence. No active bleeding noted. Moderate edema noted to the surgical extremity.  Assessment: 1. s/p removal of foreign body right foot. DOS: 11/16/2018   Plan of Care:  1. Patient was evaluated. 2. Sutures removed.  3. Recommended good shoe gear.  4. May resume full activity with no restrictions.  5. Return to clinic as needed.    Felecia Shelling, DPM [facility]  Dr. Felecia Shelling, DPM    [address]                                        [place], [place] [address]                Office [phone]  Fax [phone]

## 2018-12-18 ENCOUNTER — Encounter: Payer: Self-pay | Admitting: Podiatry

## 2020-01-16 NOTE — Progress Notes (Signed)
Surgical Weight Loss Program Consult Prep      Patient: Kaitlyn Wolfe MRN: <[id number]>     Date of Birth: 02/26/1966  Age: 54 y.o.  Sex: female      Need to address:  Meds, no diet,   Seeking Sleeve Gastrectomy procedure  She has an Ideal body weight of 150 lbs.  ~ BMI = 39  30-day Bariatric Surgical Risk Percentage: 2.83%      PCP: Dr. Jonathan Edward, MD    Insurance company and diet requirements:  [employer] [place], no diet required   Online seminar  Smoking status Never  Depression Screening Reviewed, Negative  Menstrual history LMP 12/2019  Review FH    MEDICAL HISTORY:  Morbid Obesity  Depression  ADHD    Comorbidity Yes or No   Obstructive Sleep Apnea  CPAP/BIPAP use= No   Diabetes Mellitus  Insulin dependent =  Last A1c= No- Pre Diabetes   Hypertension-  Number of medications=0 Yes   Gastroesophageal Reflux  Treatment Med= No   Hyperlipidemia withOUT medication Yes   Coronary Artery Disease No   Cancer No   Asthma No   Osteoarthritis No   Joint Pain No       Other Yes or No   Venous Stasis No   Dialysis No   Long term use of steroids or immunocompromised conditions No   On Anticoagulants No         PRIOR WEIGHT LOSS ATTEMPTS:  4-10 attempts including Weight Watchers, Atkins, Nutritionist/Dietitian, NOOM, [place]     EXERCISE ASSESSMENT:  Walking 3x week for ~ 30 minutes    DENTAL ISSUES:  No dental issues with chewing     PSYCHOSOCIAL:  She notes she  is married and states main support person is Sue Norris (Wife).  She is a Retired School Teacher.  Her goal in pursuing surgical weight loss is to improve health.  She  reports appropriate treatment of depression and anxiety.  She states she is independent in her care, can drive a car, and can ambulate without assistance.      HISTORY:  Past Medical History:   Diagnosis Date   ??? Depression    ??? Hypercholesterolemia    ??? Hypertension    ??? Morbid obesity (HCC)        She denies personal or family history of problems with anesthesia, bleeding, or clotting.  She denies  history of DVT or pulmonary embolism.  She denies reflux or dysphagia.      Past Surgical History:   Procedure Laterality Date   ??? HX KNEE ARTHROSCOPY  2019     Social History     Tobacco Use   ??? Smoking status: Never Smoker   ??? Smokeless tobacco: Never Used   Substance Use Topics   ??? Alcohol use: Not Currently   ??? Drug use: Never     Family History   Problem Relation Age of Onset   ??? Diabetes Mother    ??? Hypertension Mother    ??? Stroke Mother    ??? Heart Disease Mother    ??? Cancer Father    ??? Hypertension Father    ??? Diabetes Maternal Aunt    ??? Hypertension Maternal Aunt    ??? Cancer Paternal Grandmother         MEDICATIONS:  Current Outpatient Medications   Medication Sig   ??? FLUoxetine (PROzac) 40 mg capsule Take 40 mg by mouth daily.   ??? lamoTRIgine (LaMICtal) 100 mg tablet Take   100 mg by mouth two (2) times a day.   ??? progesterone (PROMETRIUM) 200 mg capsule Take 200 mg by mouth daily.     No current facility-administered medications for this visit.        ALLERGIES:  Allergies   Allergen Reactions   ??? Latex Rash   ??? Betadine [Povidone-Iodine] Rash       PLAN:    1.  Complete bariatric labs after dietitian evaluation.  2.  Participation in our behavior modification program, reduced calorie diet program, and participation in our exercise program recommendations supervised by our office and registered dietitian and certified personal trainer, Ronie Spies,  RD,LD.  3.  Complete our required psychological evaluation and attend our surgical weight loss support group.  4.  Upper GI:  history of heartburn.  5.  PT Consult      Gerri Lins, NP

## 2020-01-16 NOTE — Progress Notes (Signed)
Surgical Weight Loss Program Consult Prep      Patient: Kaitlyn Wolfe MRN: <[id number]>     Date of Birth: 1966/04/05  Age: 54 y.o.  Sex: female      Need to address:  Meds, no diet,   Seeking Sleeve Gastrectomy procedure  She has an Ideal body weight of 150 lbs.  ~ BMI = 39  30-day Bariatric Surgical Risk Percentage: 2.83%      PCP: Dr. Ramon Dredge, [place] and diet requirements:  [employer] [place], no diet required   Online seminar  Smoking status Never  Depression Screening Reviewed, Negative  Menstrual history LMP 12/2019  Review FH    MEDICAL HISTORY:  Morbid Obesity  Depression  ADHD    Comorbidity Yes or No   Obstructive Sleep Apnea  CPAP/BIPAP use= No   Diabetes Mellitus  Insulin dependent =  Last A1c= No- Pre Diabetes   Hypertension-  Number of medications=0 Yes   Gastroesophageal Reflux  Treatment Med= No   Hyperlipidemia withOUT medication Yes   Coronary Artery Disease No   Cancer No   Asthma No   Osteoarthritis No   Joint Pain No       Other Yes or No   Venous Stasis No   Dialysis No   Long term use of steroids or immunocompromised conditions No   On Anticoagulants No         PRIOR WEIGHT LOSS ATTEMPTS:  4-10 attempts including Weight Watchers, Atkins, Nutritionist/Dietitian, NOOM, [place]     EXERCISE ASSESSMENT:  Walking 3x week for ~ 30 minutes    DENTAL ISSUES:  No dental issues with chewing     PSYCHOSOCIAL:  She notes she  is married and states main support person is Vesta Mixer (Wife).  She is a Retired Education officer, museum.  Her goal in pursuing surgical weight loss is to improve health.  She  reports appropriate treatment of depression and anxiety.  She states she is independent in her care, can drive a car, and can ambulate without assistance.      HISTORY:  Past Medical History:   Diagnosis Date   ??? Depression    ??? Hypercholesterolemia    ??? Hypertension    ??? Morbid obesity ([place])        She denies personal or family history of problems with anesthesia, bleeding, or clotting.  She denies  history of DVT or pulmonary embolism.  She denies reflux or dysphagia.      Past Surgical History:   Procedure Laterality Date   ??? HX KNEE ARTHROSCOPY  2019     Social History     Tobacco Use   ??? Smoking status: Never Smoker   ??? Smokeless tobacco: Never Used   Substance Use Topics   ??? Alcohol use: Not Currently   ??? Drug use: Never     Family History   Problem Relation Age of Onset   ??? Diabetes Mother    ??? Hypertension Mother    ??? Stroke Mother    ??? Heart Disease Mother    ??? Cancer Father    ??? Hypertension Father    ??? Diabetes Maternal Aunt    ??? Hypertension Maternal Aunt    ??? Cancer Paternal Grandmother         MEDICATIONS:  Current Outpatient Medications   Medication Sig   ??? FLUoxetine (PROzac) 40 mg capsule Take 40 mg by mouth daily.   ??? lamoTRIgine (LaMICtal) 100 mg tablet Take  100 mg by mouth two (2) times a day.   ??? progesterone (PROMETRIUM) 200 mg capsule Take 200 mg by mouth daily.     No current facility-administered medications for this visit.        ALLERGIES:  Allergies   Allergen Reactions   ??? Latex Rash   ??? Betadine [Povidone-Iodine] Rash       PLAN:    1.  Complete bariatric labs after dietitian evaluation.  2.  Participation in our behavior modification program, reduced calorie diet program, and participation in our exercise program recommendations supervised by our office and registered dietitian and certified personal trainer, Ronie Spies,  RD,LD.  3.  Complete our required psychological evaluation and attend our surgical weight loss support group.  4.  Upper GI:  history of heartburn.  5.  PT Consult      Gerri Lins, NP

## 2020-01-18 ENCOUNTER — Encounter

## 2020-01-31 ENCOUNTER — Ambulatory Visit: Payer: BLUE CROSS/BLUE SHIELD | Primary: Family Medicine

## 2020-02-05 ENCOUNTER — Ambulatory Visit: Payer: BLUE CROSS/BLUE SHIELD | Primary: Family Medicine

## 2020-02-05 ENCOUNTER — Encounter: Payer: BLUE CROSS/BLUE SHIELD | Attending: Surgery | Primary: Family Medicine

## 2020-02-05 NOTE — Progress Notes (Deleted)
Blake Divine, MD                                  [address]                                  [place] [address]                                  Phone [phone], Fax [phone]      History and Physical    Patient: Kaitlyn Wolfe MRN: <[id number]>     Date of Birth: Aug 13, 1966  Age: 54 y.o.  Sex: female              PCP: No primary care provider on file.   Date:  01/16/2020      Marcos Peloso  who presents to the [facility] for consideration of bariatric surgery.  This is her initial consultation preparing her for our multi-disciplinary surgical preparatory regimen.  She presents with a height of   and weight of  , giving her a There is no height or weight on file to calculate BMI.  She has an Ideal body weight of 150 lbs, and excess body weight of *** lbs.    30-day Bariatric Surgical Risk Percentage: 2.83%    MEDICAL HISTORY:  Morbid Obesity  Depression  ADHD  ??  Comorbidity Yes or No   Obstructive Sleep Apnea  CPAP/BIPAP use= No   Diabetes Mellitus  Insulin dependent =  Last A1c= No- Pre Diabetes   Hypertension-  Number of medications=0 Yes   Gastroesophageal Reflux  Treatment Med= No   Hyperlipidemia withOUT medication Yes   Coronary Artery Disease No   Cancer No   Asthma No   Osteoarthritis No   Joint Pain No   ??  ??  Other Yes or No   Venous Stasis No   Dialysis No   Long term use of steroids or immunocompromised conditions No   On Anticoagulants No   ??  ??  ??  PRIOR WEIGHT LOSS ATTEMPTS:  4-10 attempts including Weight Watchers, Atkins, Nutritionist/Dietitian, NOOM, [place]   ??  EXERCISE ASSESSMENT:  Walking 3x week for ~ 30 minutes  ??  DENTAL ISSUES:  No dental issues with chewing   ??  PSYCHOSOCIAL:  She notes she  is married and states main support person is Kaitlyn Wolfe (Wife).  She is a Retired Engineer, site.  Her goal in pursuing surgical weight loss is to improve health.  She  reports appropriate treatment of depression and  anxiety.  She states she is independent in her care, can drive a car, and can ambulate without assistance.    HISTORY:  Past Medical History:   Diagnosis Date   ??? Depression    ??? Hypercholesterolemia    ??? Hypertension    ??? Morbid obesity (HCC)        She denies personal or family history of problems with anesthesia, bleeding, or clotting.  She denies history of DVT or pulmonary embolism.  She denies reflux or dysphagia.      Past Surgical History:   Procedure Laterality Date   ??? HX KNEE ARTHROSCOPY  2019     Social History     Tobacco Use   ???  Smoking status: Never Smoker   ??? Smokeless tobacco: Never Used   Substance Use Topics   ??? Alcohol use: Not Currently   ??? Drug use: Never     Family History   Problem Relation Age of Onset   ??? Diabetes Mother    ??? Hypertension Mother    ??? Stroke Mother    ??? Heart Disease Mother    ??? Cancer Father    ??? Hypertension Father    ??? Diabetes Maternal Aunt    ??? Hypertension Maternal Aunt    ??? Cancer Paternal Grandmother         MEDICATIONS:  Current Outpatient Medications   Medication Sig   ??? FLUoxetine (PROzac) 40 mg capsule Take 40 mg by mouth daily.   ??? lamoTRIgine (LaMICtal) 100 mg tablet Take 100 mg by mouth two (2) times a day.   ??? progesterone (PROMETRIUM) 200 mg capsule Take 200 mg by mouth daily.     No current facility-administered medications for this visit.        ALLERGIES:  Allergies   Allergen Reactions   ??? Latex Rash   ??? Betadine [Povidone-Iodine] Rash       ROS: The patient has no difficulty with chest pain or shortness of breath.  No fever or chills.  Comprehensive 13 point review of systems was otherwise unremarkable except as noted above.    PHYSICAL EXAM:   There were no vitals taken for this visit.    General: Alert oriented cooperative patient in no acute distress.   Eyes: Sclera are clear. Conjunctiva and lids within normal limits. No icterus.  Ears and Nose: no gross deformities to visual inspection, gross hearing intact  Neck: Supple, trachea midline, no  appreciable thyromegaly  Resp: No JVD.  Breathing is  non-labored. Lungs clear to auscultation without wheezing or rhonchi   CV: RRR. No murmurs, rubs or gallops appreciated, Carotid bruits are absent  Abd: soft non-tender and non-distended without peritoneal signs. +bs    Extremities: non-tender. No edema   Neuro:  No focal signs  Psych:  Mood and affect appropriate.  Short-term memory and understanding intact    ASSESSMENT:  Morbid obesity with a  There is no height or weight on file to calculate BMI. beginning multi-disciplinary surgical prep program.    PLAN:  We have discussed the patient's participation and reviewed the steps in our preparatory program. She has had a long history of failed diet and exercise programs and has good understanding about the risks, benefits, and commitment related to bariatric surgery.  She has attended our comprehensive educational seminar.    She is interested in proceeding with our program seeking the sleeve gastrectomy.     Nutrition Recommendations:   ?? Diet Rx ??? Low-moderate calorie intake (~***-*** kcal/day, based on 14-16kcal/kg ABW).  Work on eating at least 3x/d with lean protein focus. 2 week pre-operative diet with caloric restriction of ~1200kcal/d.     Exercise Recommendations: Exercise routine, incorporating both cardio and strength training, building up to 5x/wk for at least 30 minutes. Physical therapy evaluation for guidance on exercise routine.     She will complete the following steps:  1.  Complete bariatric labs after dietitian evaluation.  2.  Participation in our behavior modification program, reduced calorie diet program, and participation in our exercise program recommendations supervised by our office.   3.  Complete our required psychological evaluation.   4.  Reminded of policy of no weight gain during prep period.  5.  Upper GI  6.  PT Consult       She will return after the above are complete for assessment of her progress and schedule surgery.     Milda Smart Dareen Piano MD    Date:  01/16/2020

## 2020-02-06 ENCOUNTER — Encounter: Primary: Family Medicine
